# Patient Record
Sex: Female | Born: 1970 | Race: White | Hispanic: No | Smoking: Current every day smoker
Health system: Southern US, Community
[De-identification: ages and names within clinical notes are randomized; demographics above are authoritative.]

## PROBLEM LIST (undated history)

## (undated) DIAGNOSIS — M1990 Unspecified osteoarthritis, unspecified site: Secondary | ICD-10-CM

## (undated) DIAGNOSIS — E119 Type 2 diabetes mellitus without complications: Secondary | ICD-10-CM

## (undated) DIAGNOSIS — I10 Essential (primary) hypertension: Secondary | ICD-10-CM

## (undated) HISTORY — PX: TOE SURGERY: SHX1073

## (undated) HISTORY — PX: TYMPANOSTOMY TUBE PLACEMENT: SHX32

## (undated) HISTORY — PX: WISDOM TOOTH EXTRACTION: SHX21

## (undated) HISTORY — PX: TONSILLECTOMY AND ADENOIDECTOMY: SUR1326

---

## 2005-02-05 HISTORY — PX: BUNIONECTOMY: SHX129

## 2015-04-07 DIAGNOSIS — T85848A Pain due to other internal prosthetic devices, implants and grafts, initial encounter: Secondary | ICD-10-CM | POA: Insufficient documentation

## 2015-04-07 DIAGNOSIS — M2041 Other hammer toe(s) (acquired), right foot: Secondary | ICD-10-CM | POA: Insufficient documentation

## 2015-04-10 DIAGNOSIS — N921 Excessive and frequent menstruation with irregular cycle: Secondary | ICD-10-CM | POA: Insufficient documentation

## 2015-05-09 DIAGNOSIS — M2550 Pain in unspecified joint: Secondary | ICD-10-CM | POA: Insufficient documentation

## 2015-05-09 DIAGNOSIS — J0110 Acute frontal sinusitis, unspecified: Secondary | ICD-10-CM | POA: Insufficient documentation

## 2015-05-09 DIAGNOSIS — J309 Allergic rhinitis, unspecified: Secondary | ICD-10-CM | POA: Insufficient documentation

## 2015-05-09 DIAGNOSIS — L409 Psoriasis, unspecified: Secondary | ICD-10-CM | POA: Insufficient documentation

## 2015-05-09 DIAGNOSIS — F329 Major depressive disorder, single episode, unspecified: Secondary | ICD-10-CM | POA: Insufficient documentation

## 2015-05-09 DIAGNOSIS — N912 Amenorrhea, unspecified: Secondary | ICD-10-CM | POA: Insufficient documentation

## 2015-05-09 DIAGNOSIS — M62838 Other muscle spasm: Secondary | ICD-10-CM | POA: Insufficient documentation

## 2015-05-09 DIAGNOSIS — R609 Edema, unspecified: Secondary | ICD-10-CM | POA: Insufficient documentation

## 2015-05-09 DIAGNOSIS — M79673 Pain in unspecified foot: Secondary | ICD-10-CM | POA: Insufficient documentation

## 2015-05-09 DIAGNOSIS — K449 Diaphragmatic hernia without obstruction or gangrene: Secondary | ICD-10-CM

## 2015-05-09 DIAGNOSIS — K219 Gastro-esophageal reflux disease without esophagitis: Secondary | ICD-10-CM | POA: Insufficient documentation

## 2015-05-09 DIAGNOSIS — R7309 Other abnormal glucose: Secondary | ICD-10-CM | POA: Insufficient documentation

## 2015-05-09 DIAGNOSIS — F32A Depression, unspecified: Secondary | ICD-10-CM | POA: Insufficient documentation

## 2015-05-09 DIAGNOSIS — H1010 Acute atopic conjunctivitis, unspecified eye: Secondary | ICD-10-CM | POA: Insufficient documentation

## 2015-05-09 DIAGNOSIS — I10 Essential (primary) hypertension: Secondary | ICD-10-CM | POA: Insufficient documentation

## 2015-05-10 DIAGNOSIS — Z79899 Other long term (current) drug therapy: Secondary | ICD-10-CM | POA: Insufficient documentation

## 2016-03-20 DIAGNOSIS — Z862 Personal history of diseases of the blood and blood-forming organs and certain disorders involving the immune mechanism: Secondary | ICD-10-CM | POA: Insufficient documentation

## 2016-03-20 DIAGNOSIS — S81801S Unspecified open wound, right lower leg, sequela: Secondary | ICD-10-CM | POA: Insufficient documentation

## 2016-08-04 DIAGNOSIS — F411 Generalized anxiety disorder: Secondary | ICD-10-CM | POA: Insufficient documentation

## 2016-08-10 DIAGNOSIS — E785 Hyperlipidemia, unspecified: Secondary | ICD-10-CM | POA: Insufficient documentation

## 2017-04-05 ENCOUNTER — Ambulatory Visit (INDEPENDENT_AMBULATORY_CARE_PROVIDER_SITE_OTHER): Payer: BLUE CROSS/BLUE SHIELD

## 2017-04-05 ENCOUNTER — Encounter: Payer: Self-pay | Admitting: Podiatry

## 2017-04-05 ENCOUNTER — Ambulatory Visit (INDEPENDENT_AMBULATORY_CARE_PROVIDER_SITE_OTHER): Payer: BLUE CROSS/BLUE SHIELD | Admitting: Podiatry

## 2017-04-05 DIAGNOSIS — M779 Enthesopathy, unspecified: Secondary | ICD-10-CM | POA: Diagnosis not present

## 2017-04-05 DIAGNOSIS — M79671 Pain in right foot: Secondary | ICD-10-CM

## 2017-04-05 DIAGNOSIS — M79673 Pain in unspecified foot: Secondary | ICD-10-CM

## 2017-04-05 DIAGNOSIS — M79672 Pain in left foot: Secondary | ICD-10-CM

## 2017-04-05 DIAGNOSIS — G8929 Other chronic pain: Secondary | ICD-10-CM | POA: Diagnosis not present

## 2017-04-05 DIAGNOSIS — L4050 Arthropathic psoriasis, unspecified: Secondary | ICD-10-CM

## 2017-04-05 NOTE — Progress Notes (Signed)
Subjective:   Patient ID: Sherry Stein, female   DOB: 47 y.o.   MRN: [id number]   HPI 47 year old female presents the office today for concerns of bilateral foot.  The left side worse than right.  She gets pain to the outside aspect of her left foot mostly.  She does have a history of psoriatic arthritis who is currently on Humira, methotrexate, diclofenac and folic acid.  She states that she previously has had orthotics but they are several years old.  She has had multiple foot surgeries on the right side.  She has noticed that her foot is very flat her toes are turning.  She gets pain the also into the foot with walking.  She has limited activity in general given her feet and this is an ongoing issue.  She has no other concerns today.  No recent injury.  No increased swelling that she is noticed.   ROS History reviewed. No pertinent past medical history.  History reviewed. No pertinent surgical history.   Current Outpatient Medications:  .  acetaminophen-codeine (TYLENOL #3) 300-30 MG tablet, TAKE 1 TABLET BY MOUTH EVERY 8 HOURS AS NEEDED, Disp: , Rfl:  .  Adalimumab 40 MG/0.8ML PNKT, Inject into the skin., Disp: , Rfl:  .  DULoxetine (CYMBALTA) 20 MG capsule, TAKE 2 CAPSULES BY MOUTH DAILY, Disp: , Rfl:  .  lisinopril-hydrochlorothiazide (PRINZIDE,ZESTORETIC) 20-12.5 MG tablet, Take by mouth., Disp: , Rfl:  .  methotrexate (RHEUMATREX) 2.5 MG tablet, Take 8 tabs once weekly, Disp: , Rfl:  .  rosuvastatin (CRESTOR) 20 MG tablet, Take by mouth., Disp: , Rfl:  .  benzonatate (TESSALON) 100 MG capsule, , Disp: , Rfl: 0 .  clarithromycin (BIAXIN XL) 500 MG 24 hr tablet, , Disp: , Rfl: 0 .  diclofenac (VOLTAREN) 75 MG EC tablet, TK 1 T PO BID WF OR MILK, Disp: , Rfl: 3 .  folic acid (FOLVITE) 1 MG tablet, TK 1 T PO QD, Disp: , Rfl: 3 .  VITAMIN D, CHOLECALCIFEROL, PO, Take by mouth., Disp: , Rfl:   No Known Allergies  Social History   Socioeconomic History  . Marital status: Single   Spouse name: Not on file  . Number of children: Not on file  . Years of education: Not on file  . Highest education level: Not on file  Social Needs  . Financial resource strain: Not on file  . Food insecurity - worry: Not on file  . Food insecurity - inability: Not on file  . Transportation needs - medical: Not on file  . Transportation needs - non-medical: Not on file  Occupational History  . Not on file  Tobacco Use  . Smoking status: Unknown If Ever Smoked  . Smokeless tobacco: Never Used  Substance and Sexual Activity  . Alcohol use: Not on file  . Drug use: Not on file  . Sexual activity: Not on file  Other Topics Concern  . Not on file  Social History Narrative  . Not on file        Objective:  Physical Exam  General: AAO x3, NAD  Dermatological: Skin is warm, dry and supple bilateral. Nails x 10 are well manicured; remaining integument appears unremarkable at this time. There are no open sores, no preulcerative lesions, no rash or signs of infection present.  Vascular: Dorsalis Pedis artery and Posterior Tibial artery pedal pulses are 2/4 bilateral with immedate capillary fill time. Pedal hair growth present. No varicosities and no lower extremity edema present  bilateral. There is no pain with calf compression, swelling, warmth, erythema.   Neruologic: Grossly intact via light touch bilateral. Protective threshold with Semmes Wienstein monofilament intact to all pedal sites bilateral.   Musculoskeletal: There is a significant decrease in medial arch upon weightbearing.  There is tenderness to the lateral aspect of the foot and on the talonavicular joint there is no specific area pinpoint bony tenderness or pain to vibratory sensation.  Equinus is present.  There is no significant edema there is no erythema or increased warmth.  Muscular strength 5/5 in all groups tested bilateral.  Gait: Unassisted, Nonantalgic.       Assessment:   Bilateral foot pain left side  worse than right, psoriatic arthritis     Plan:  -Treatment options discussed including all alternatives, risks, and complications -Etiology of symptoms were discussed -X-rays were obtained and reviewed with the patient.  No definitive evidence of acute fracture identified today.  There is arthritic changes bilaterally but most notably there is arthritic changes present to the Lisfranc joint on the left side more so compared to the right.  This is where she has her symptoms as well.  We discussed various treatment options for this.  Her orthotics are quite old.  I do think that a new orthotic would be beneficial for her.  There are minimal her for inserts.  I will have her come back in to see Fenton FoyBetha or Raiford NobleRick for molding and she agrees with this plan.  She has no other concerns today.  She has Voltaren gel at home and she continues to do well.  Vivi BarrackMatthew R Mariha Sleeper DPM

## 2017-04-16 ENCOUNTER — Other Ambulatory Visit: Payer: BLUE CROSS/BLUE SHIELD

## 2017-05-07 ENCOUNTER — Ambulatory Visit (INDEPENDENT_AMBULATORY_CARE_PROVIDER_SITE_OTHER): Payer: BLUE CROSS/BLUE SHIELD | Admitting: Orthotics

## 2017-05-07 DIAGNOSIS — M7750 Other enthesopathy of unspecified foot: Secondary | ICD-10-CM | POA: Diagnosis not present

## 2017-05-07 DIAGNOSIS — M779 Enthesopathy, unspecified: Secondary | ICD-10-CM

## 2017-05-07 DIAGNOSIS — G8929 Other chronic pain: Secondary | ICD-10-CM

## 2017-05-07 DIAGNOSIS — M79673 Pain in unspecified foot: Secondary | ICD-10-CM

## 2017-05-07 NOTE — Progress Notes (Signed)
Patient came in today to pick up custom made foot orthotics.  The goals were accomplished and the patient reported no dissatisfaction with said orthotics.  Patient was advised of breakin period and how to report any issues. 

## 2017-07-23 DIAGNOSIS — M79676 Pain in unspecified toe(s): Secondary | ICD-10-CM

## 2017-10-24 DIAGNOSIS — M79676 Pain in unspecified toe(s): Secondary | ICD-10-CM

## 2018-08-24 ENCOUNTER — Other Ambulatory Visit: Payer: Self-pay

## 2018-08-24 ENCOUNTER — Emergency Department (HOSPITAL_COMMUNITY): Payer: Self-pay

## 2018-08-24 ENCOUNTER — Emergency Department (HOSPITAL_COMMUNITY)
Admission: EM | Admit: 2018-08-24 | Discharge: 2018-08-24 | Disposition: A | Discharge status: Home / Self Care | Payer: Self-pay | Attending: Emergency Medicine | Admitting: Emergency Medicine

## 2018-08-24 ENCOUNTER — Encounter (HOSPITAL_COMMUNITY): Payer: Self-pay | Admitting: Emergency Medicine

## 2018-08-24 DIAGNOSIS — L4050 Arthropathic psoriasis, unspecified: Secondary | ICD-10-CM | POA: Insufficient documentation

## 2018-08-24 DIAGNOSIS — M25561 Pain in right knee: Secondary | ICD-10-CM

## 2018-08-24 DIAGNOSIS — E119 Type 2 diabetes mellitus without complications: Secondary | ICD-10-CM | POA: Insufficient documentation

## 2018-08-24 DIAGNOSIS — Z87891 Personal history of nicotine dependence: Secondary | ICD-10-CM | POA: Insufficient documentation

## 2018-08-24 DIAGNOSIS — I10 Essential (primary) hypertension: Secondary | ICD-10-CM | POA: Insufficient documentation

## 2018-08-24 HISTORY — DX: Essential (primary) hypertension: I10

## 2018-08-24 HISTORY — DX: Unspecified osteoarthritis, unspecified site: M19.90

## 2018-08-24 HISTORY — DX: Type 2 diabetes mellitus without complications: E11.9

## 2018-08-24 NOTE — Discharge Instructions (Addendum)
Please follow up with your PCP regarding your knee pain You can also try to follow up with orthopedist Dr. Percell Miller if you choose to  Continue taking your Voltaren as prescribed  Return to the ED for any redness, increased warmth, or swelling of your knee or if you have difficulty bending it

## 2018-08-24 NOTE — ED Triage Notes (Signed)
Pt reports psoriatic arthritis. C/o pain to bilateral hands, feet and right knee. Ambulatory with cane.

## 2018-08-24 NOTE — ED Provider Notes (Signed)
[facility] EMERGENCY DEPARTMENT Provider Note   CSN: [id number] Arrival date & time: 08/24/18  0542    History   Chief Complaint Chief Complaint  Patient presents with   Joint Pain    HPI Sherry Stein is a 47 y.o. female with PMHx psoriatic arthritis, diabetes, HTN who presents to the ED complaining of sudden onset, constant, sharp, atraumatic, right knee pain that began 5 days ago. Pt reports hx of psoriatic arthritis and typically walks with a cane because of joint pain but states this pain is more severe. She went to an urgent care clinic who wanted to do xrays before doing a joint infection but did not have the capability. Pt is also in the process of having a disability appeal next week and would like xrays to take with her. She reports the urgent care prescribed her percocet for pain. She also takes Voltaren for discomfort. Denies fever, chills, swelling, weakness, numbness, or any other associated symptoms.        Past Medical History:  Diagnosis Date   Arthritis    Diabetes mellitus without complication ([place])    Hypertension     There are no active problems to display for this patient.   History reviewed. No pertinent surgical history.   OB History   No obstetric history on file.      Home Medications    Prior to Admission medications   Not on File    Family History No family history on file.  Social History Social History   Tobacco Use   Smoking status: Former Smoker   Smokeless tobacco: Never Used  Substance Use Topics   Alcohol use: Not Currently   Drug use: Never     Allergies   Patient has no known allergies.   Review of Systems Review of Systems  Constitutional: Negative for chills and fever.  Musculoskeletal: Positive for arthralgias. Negative for joint swelling.  Skin: Negative for color change.     Physical Exam Updated Vital Signs BP (!) 144/92 (BP Location: Right Arm)    Pulse 84    Temp 98.4  F (36.9 C) (Oral)    Resp 18    SpO2 98%   Physical Exam Vitals signs and nursing note reviewed.  Constitutional:      Appearance: She is not ill-appearing.  HENT:     Head: Normocephalic and atraumatic.  Eyes:     Conjunctiva/sclera: Conjunctivae normal.  Cardiovascular:     Rate and Rhythm: Normal rate and regular rhythm.  Pulmonary:     Effort: Pulmonary effort is normal.     Breath sounds: Normal breath sounds.  Musculoskeletal:     Comments: No obvious swelling noted to right knee compared to left. Pt has minimal tenderness to anterior aspect along patellofemoral tendon with palpation. No tenderness to posterior aspect of knee. No Baker's cyst appreciated. ROM intact throughout. Strength and sensation intact. 2+ distal pulses.   Skin:    General: Skin is warm and dry.     Coloration: Skin is not jaundiced.  Neurological:     Mental Status: She is alert.      ED Treatments / Results  Labs (all labs ordered are listed, but only abnormal results are displayed) Labs Reviewed - No data to display  EKG None  Radiology Dg Knee Complete 4 Views Right  Result Date: 08/24/2018 CLINICAL DATA:  RIGHT knee pain. History of psoriatic arthritis. EXAM: RIGHT KNEE - COMPLETE 4+ VIEW COMPARISON:  None. FINDINGS: Severe  tricompartmental degenerative osteoarthritis, with associated joint space narrowings and large osteophyte formation. Most severe degenerative change is seen at the medial and patellofemoral compartments. No acute appearing osseous abnormality. Probable joint effusion within the suprapatellar bursa. Superficial soft tissues about the RIGHT knee are unremarkable. IMPRESSION: 1. Severe tricompartmental degenerative osteoarthritis. 2. Probable joint effusion. 3. No acute findings. Electronically Signed   By: Bary RichardStan  Maynard M.D.   On: 08/24/2018 08:39    Procedures Procedures (including critical care time)  Medications Ordered in ED Medications - No data to  display   Initial Impression / Assessment and Plan / ED Course  I have reviewed the triage vital signs and the nursing notes.  Pertinent labs & imaging results that were available during my care of the patient were reviewed by me and considered in my medical decision making (see chart for details).    48 year old female presenting with right knee pain, atraumatic, hx of psoriatic arthritis. States this feels different. Requesting xrays for disability appeals hearing next week. Will oblige today although suspect typically arthritic changes on xray. No joint effusion palpated. No erythema or edema to suggest gout vs septic arthritis. Pt without complaints of fever or chills. She is afebrile in the ED today as well without tachycardia or tachypnea. Reports taking Percocet for pain although PMDP reviewed without findings. Will reevaluate once imaging returns.   Xrays consistent with arthritic changes; radiologist also reads probable joint effusion to suprapatellar bursa. No skin changes overlying the knee during my exam. No fevers or chills at home and VSS. Do not feel pt needs joint aspiration at this time. Discussed medications optioned with patient; offered short course of prednisone although pt declined given hx of diabetes as well as her being on Voltaren and need to not take NSAIDs while on the prednisone. Pt states the voltaren works for her and she would rather stick with that. Have given her referral to orthopedics; she can attempt to follow up with them although may need to self pay given she does not have insurance currently. She agrees to follow up with PCP. Strict return precautions discussed today. Pt stable for discharge home.        Final Clinical Impressions(s) / ED Diagnoses   Final diagnoses:  Arthralgia of right knee  Psoriatic arthritis [facility](HCC)    ED Discharge Orders    None       Tanda RockersVenter, Hansini Clodfelter, PA-C 08/24/18 1817    Terrilee FilesButler, Michael C, MD 08/25/18 1021

## 2018-08-24 NOTE — ED Notes (Signed)
Patient transported to X-ray 

## 2018-08-27 ENCOUNTER — Encounter: Payer: Self-pay | Admitting: Podiatry

## 2018-12-07 HISTORY — PX: REPLACEMENT TOTAL KNEE: SUR1224

## 2019-03-09 HISTORY — PX: REPLACEMENT TOTAL KNEE: SUR1224

## 2019-07-07 HISTORY — PX: CARPAL TUNNEL RELEASE: SHX101
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
## 2020-05-02 ENCOUNTER — Ambulatory Visit (INDEPENDENT_AMBULATORY_CARE_PROVIDER_SITE_OTHER): Payer: Medicare Other | Admitting: Rheumatology

## 2020-05-02 ENCOUNTER — Ambulatory Visit: Payer: Self-pay

## 2020-05-02 ENCOUNTER — Ambulatory Visit (HOSPITAL_COMMUNITY)
Admission: RE | Admit: 2020-05-02 | Discharge: 2020-05-02 | Disposition: A | Discharge status: Home / Self Care | Payer: Medicare Other | Source: Ambulatory Visit | Attending: Rheumatology | Admitting: Rheumatology

## 2020-05-02 ENCOUNTER — Encounter: Payer: Self-pay | Admitting: Rheumatology

## 2020-05-02 ENCOUNTER — Other Ambulatory Visit: Payer: Self-pay

## 2020-05-02 VITALS — BP 126/81 | HR 56 | Resp 16 | Ht 63.5 in | Wt 217.8 lb

## 2020-05-02 DIAGNOSIS — M2041 Other hammer toe(s) (acquired), right foot: Secondary | ICD-10-CM

## 2020-05-02 DIAGNOSIS — M79641 Pain in right hand: Secondary | ICD-10-CM | POA: Diagnosis not present

## 2020-05-02 DIAGNOSIS — L409 Psoriasis, unspecified: Secondary | ICD-10-CM | POA: Diagnosis not present

## 2020-05-02 DIAGNOSIS — I10 Essential (primary) hypertension: Secondary | ICD-10-CM

## 2020-05-02 DIAGNOSIS — M79672 Pain in left foot: Secondary | ICD-10-CM | POA: Diagnosis not present

## 2020-05-02 DIAGNOSIS — L4050 Arthropathic psoriasis, unspecified: Secondary | ICD-10-CM | POA: Diagnosis not present

## 2020-05-02 DIAGNOSIS — M79671 Pain in right foot: Secondary | ICD-10-CM | POA: Diagnosis not present

## 2020-05-02 DIAGNOSIS — G8929 Other chronic pain: Secondary | ICD-10-CM

## 2020-05-02 DIAGNOSIS — R5383 Other fatigue: Secondary | ICD-10-CM

## 2020-05-02 DIAGNOSIS — Z8379 Family history of other diseases of the digestive system: Secondary | ICD-10-CM

## 2020-05-02 DIAGNOSIS — M79642 Pain in left hand: Secondary | ICD-10-CM | POA: Diagnosis not present

## 2020-05-02 DIAGNOSIS — Z1211 Encounter for screening for malignant neoplasm of colon: Secondary | ICD-10-CM

## 2020-05-02 DIAGNOSIS — Z79899 Other long term (current) drug therapy: Secondary | ICD-10-CM

## 2020-05-02 DIAGNOSIS — Z96653 Presence of artificial knee joint, bilateral: Secondary | ICD-10-CM

## 2020-05-02 DIAGNOSIS — E782 Mixed hyperlipidemia: Secondary | ICD-10-CM

## 2020-05-02 DIAGNOSIS — F411 Generalized anxiety disorder: Secondary | ICD-10-CM

## 2020-05-02 DIAGNOSIS — M533 Sacrococcygeal disorders, not elsewhere classified: Secondary | ICD-10-CM

## 2020-05-02 DIAGNOSIS — R7303 Prediabetes: Secondary | ICD-10-CM

## 2020-05-02 DIAGNOSIS — K219 Gastro-esophageal reflux disease without esophagitis: Secondary | ICD-10-CM

## 2020-05-02 DIAGNOSIS — M2042 Other hammer toe(s) (acquired), left foot: Secondary | ICD-10-CM

## 2020-05-02 DIAGNOSIS — K449 Diaphragmatic hernia without obstruction or gangrene: Secondary | ICD-10-CM

## 2020-05-02 LAB — CBC WITH DIFFERENTIAL/PLATELET
HCT: 44.8 % (ref 35.0–45.0)
Neutrophils Relative %: 67.6 %

## 2020-05-02 NOTE — Patient Instructions (Addendum)
Standing Labs We placed an order today for your standing lab work.   Please have your standing labs drawn in June and every 3 months  If possible, please have your labs drawn 2 weeks prior to your appointment so that the provider can discuss your results at your appointment.  We have open lab daily Monday through Thursday from 1:30-4:30 PM and Friday from 1:30-4:00 PM at the office of Dr. Pollyann Savoy, [facility].   Please be advised, all patients with office appointments requiring lab work will take precedents over walk-in lab work.  If possible, please come for your lab work on Monday and Friday afternoons, as you may experience shorter wait times. The office is located at [address], [address], [place], [place] [address] No appointment is necessary.   Labs are drawn by [facility]. Please bring your co-pay at the time of your lab draw.  You may receive a bill from [facility] for your lab work.  If you wish to have your labs drawn at another location, please call the office 24 hours in advance to send orders.  If you have any questions regarding directions or hours of operation,  please call [phone].   As a reminder, please drink plenty of water prior to coming for your lab work. Thanks!     Methotrexate tablets What is this medicine? METHOTREXATE (METH oh TREX ate) is a chemotherapy drug used to treat cancer including breast cancer, leukemia, and lymphoma. This medicine can also be used to treat psoriasis and certain kinds of arthritis. This medicine may be used for other purposes; ask your health care provider or pharmacist if you have questions. COMMON BRAND NAME(S): Rheumatrex, Trexall What should I tell my health care provider before I take this medicine? They need to know if you have any of these conditions:  fluid in the stomach area or lungs  if you often drink alcohol  infection or immune system problems  kidney disease or on hemodialysis  liver  disease  low blood counts, like low white cell, platelet, or red cell counts  lung disease  radiation therapy  stomach ulcers  ulcerative colitis  an unusual or allergic reaction to methotrexate, other medicines, foods, dyes, or preservatives  pregnant or trying to get pregnant  breast-feeding How should I use this medicine? Take this medicine by mouth with a glass of water. Follow the directions on the prescription label. Take your medicine at regular intervals. Do not take it more often than directed. Do not stop taking except on your doctor's advice. Make sure you know why you are taking this medicine and how often you should take it. If this medicine is used for a condition that is not cancer, like arthritis or psoriasis, it should be taken weekly, NOT daily. Taking this medicine more often than directed can cause serious side effects, even death. Talk to your healthcare provider about safe handling and disposal of this medicine. You may need to take special precautions. Talk to your pediatrician regarding the use of this medicine in children. While this drug may be prescribed for selected conditions, precautions do apply. Overdosage: If you think you have taken too much of this medicine contact a poison control center or emergency room at once. NOTE: This medicine is only for you. Do not share this medicine with others. What if I miss a dose? If you miss a dose, talk with your doctor or health care professional. Do not take double or extra doses. What may  interact with this medicine? Do not take this medicine with any of the following medications:  acitretin This medicine may also interact with the following medication:  aspirin and aspirin-like medicines including salicylates  azathioprine  certain antibiotics like penicillins, tetracycline, and chloramphenicol  certain medicines that treat or prevent blood clots like warfarin, apixaban, dabigatran, and  rivaroxaban  certain medicines for stomach problems like esomeprazole, omeprazole, pantoprazole  cyclosporine  dapsone  diuretics  gold  hydroxychloroquine  live virus vaccines  medicines for infection like acyclovir, adefovir, amphotericin B, bacitracin, cidofovir, foscarnet, ganciclovir, gentamicin, pentamidine, vancomycin  mercaptopurine  NSAIDs, medicines for pain and inflammation, like ibuprofen or naproxen  other cytotoxic agents  pamidronate  pemetrexed  penicillamine  phenylbutazone  phenytoin  probenecid  pyrimethamine  retinoids such as isotretinoin and tretinoin  steroid medicines like prednisone or cortisone  sulfonamides like sulfasalazine and trimethoprim/sulfamethoxazole  theophylline  zoledronic acid This list may not describe all possible interactions. Give your health care provider a list of all the medicines, herbs, non-prescription drugs, or dietary supplements you use. Also tell them if you smoke, drink alcohol, or use illegal drugs. Some items may interact with your medicine. What should I watch for while using this medicine? Avoid alcoholic drinks. This medicine can make you more sensitive to the sun. Keep out of the sun. If you cannot avoid being in the sun, wear protective clothing and use sunscreen. Do not use sun lamps or tanning beds/booths. You may need blood work done while you are taking this medicine. Call your doctor or health care professional for advice if you get a fever, chills or sore throat, or other symptoms of a cold or flu. Do not treat yourself. This drug decreases your body's ability to fight infections. Try to avoid being around people who are sick. This medicine may increase your risk to bruise or bleed. Call your doctor or health care professional if you notice any unusual bleeding. Be careful brushing or flossing your teeth or using a toothpick because you may get an infection or bleed more easily. If you have any  dental work done, tell your dentist you are receiving this medicine. Check with your doctor or health care professional if you get an attack of severe diarrhea, nausea and vomiting, or if you sweat a lot. The loss of too much body fluid can make it dangerous for you to take this medicine. Talk to your doctor about your risk of cancer. You may be more at risk for certain types of cancers if you take this medicine. Do not become pregnant while taking this medicine or for 6 months after stopping it. Women should inform their health care provider if they wish to become pregnant or think they might be pregnant. Men should not father a child while taking this medicine and for 3 months after stopping it. There is potential for serious harm to an unborn child. Talk to your health care provider for more information. Do not breast-feed an infant while taking this medicine or for 1 week after stopping it. This medicine may make it more difficult to get pregnant or father a child. Talk to your health care provider if you are concerned about your fertility. What side effects may I notice from receiving this medicine? Side effects that you should report to your doctor or health care professional as soon as possible:  allergic reactions like skin rash, itching or hives, swelling of the face, lips, or tongue  breathing problems or shortness of breath  diarrhea  dry, nonproductive cough  low blood counts - this medicine may decrease the number of white blood cells, red blood cells and platelets. You may be at increased risk for infections and bleeding.  mouth sores  redness, blistering, peeling or loosening of the skin, including inside the mouth  signs of infection - fever or chills, cough, sore throat, pain or trouble passing urine  signs and symptoms of bleeding such as bloody or black, tarry stools; red or dark-brown urine; spitting up blood or brown material that looks like coffee grounds; red spots on  the skin; unusual bruising or bleeding from the eye, gums, or nose  signs and symptoms of kidney injury like trouble passing urine or change in the amount of urine  signs and symptoms of liver injury like dark yellow or brown urine; general ill feeling or flu-like symptoms; light-colored stools; loss of appetite; nausea; right upper belly pain; unusually weak or tired; yellowing of the eyes or skin Side effects that usually do not require medical attention (report to your doctor or health care professional if they continue or are bothersome):  dizziness  hair loss  tiredness  upset stomach  vomiting This list may not describe all possible side effects. Call your doctor for medical advice about side effects. You may report side effects to FDA at 1-800-FDA-1088. Where should I keep my medicine? Keep out of the reach of children and pets. Store at room temperature between 20 and 25 degrees C (68 and 77 degrees F). Protect from light. Get rid of any unused medicine after the expiration date. Talk to your health care provider about how to dispose of unused medicine. Special directions may apply. NOTE: This sheet is a summary. It may not cover all possible information. If you have questions about this medicine, talk to your doctor, pharmacist, or health care provider.  2021 Elsevier/Gold Standard (2019-08-24 10:40:39)  COVID-19 vaccine recommendations:   COVID-19 vaccine is recommended for everyone (unless you are allergic to a vaccine component), even if you are on a medication that suppresses your immune system.   If you are on Methotrexate, Cellcept (mycophenolate), Rinvoq, Harriette Ohara, and Olumiant- hold the medication for 1 week after each vaccine. Hold Methotrexate for 2 weeks after the single dose COVID-19 vaccine.   The recommendations are for the patients on immunosuppressive therapy should receive first 3 COVID-19 vaccines 1 month apart and then a fourth dose (booster) 3 months after  the third dose.  Do not take Tylenol or any anti-inflammatory medications (NSAIDs) 24 hours prior to the COVID-19 vaccination.   There is no direct evidence about the efficacy of the COVID-19 vaccine in individuals who are on medications that suppress the immune system.   Even if you are fully vaccinated, and you are on any medications that suppress your immune system, please continue to wear a mask, maintain at least six feet social distance and practice hand hygiene.   If you develop a COVID-19 infection, please contact your PCP or our office to determine if you need monoclonal antibody infusion.  The booster vaccine is now available for immunocompromised patients.   Please see the following web sites for updated information.   [email or web address]  Vaccines You are taking a medication(s) that can suppress your immune system.  The following immunizations are recommended: . Flu annually . Covid-19  . Pneumonia (Pneumovax 23 and Prevnar 13 spaced at least 1 year apart) . Shingrix (after age 52)  Please check with your PCP to make sure  you are up to date.  Heart Disease Prevention   Your inflammatory disease increases your risk of heart disease which includes heart attack, stroke, atrial fibrillation (irregular heartbeats), high blood pressure, heart failure and atherosclerosis (plaque in the arteries).  It is important to reduce your risk by:   . Keep blood pressure, cholesterol, and blood sugar at healthy levels   . Smoking Cessation   . Maintain a healthy weight  o BMI 20-25   . Eat a healthy diet  o Plenty of fresh fruit, vegetables, and whole grains  o Limit saturated fats, foods high in sodium, and added sugars  o DASH and Mediterranean diet   . Increase physical activity  o Recommend moderate physically activity for 150 minutes per week/ 30 minutes a day for five days a week These can be broken up into  three separate ten-minute sessions during the day.   . Reduce Stress  . Meditation, slow breathing exercises, yoga, coloring books  . Dental visits twice a year

## 2020-05-02 NOTE — Progress Notes (Signed)
Pharmacy Note  Subjective:  Patient presents today to [facility] for follow up office visit.   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 Patient will continue methotrexate 25mg  once weekly with folic acid. She has tried many biologics in the past, including Humira and Cosentyx. She stopped Cosentyx more than a year ago but she is familiar with patient assistance programs as this is how she accesses all of her medications.  Objective:  Baseline labs drawn today  Chest x-ray: ordered today  Does patient have a history of inflammatory bowel disease? No; son has Crohn's disease - GI referral placed  Assessment/Plan:  Counseled patient that Cosentyx is a IL-17 inhibitor that works to reduce pain and inflammation associated with arthritis.  Counseled patient on purpose, proper use, and adverse effects of Cosentyx.  Reviewed the most common adverse effects of infection, inflammatory bowel disease, and allergic reaction.  Counseled patient that Cosentyx should be held prior to scheduled surgery.  Counseled patient to avoid live vaccines while on Cosentyx.  Recommend annual influenza, Pneumovax 23, Prevnar 13, and Shingrix as indicated.   Patient will continue methotrexate 25mg  once weekly (in divided doses). Patient will increase folic acid to 2 mg once daily. Advised patient to divide methotrexate: 5 tabs in morning, 5 tabs in evening or otherwise 5 tabs on Wednesday and 5 tabs on Saturday to increase absorption and maximize effectiveness. At this time, she declined transitioning to injectable MTX - but will reach out if she'd like to do so. She would access Rasuvo/Otrexip via patient assistance as well. Patient verbalized understanding  Reviewed the importance of regular labs while on Cosentyx. Standing orders placed. Provided patient with medication education material and answered all questions.  Patient consented to  Cosentyx.  Will upload consent into patient's chart.  Will apply for Cosentyx through patient's insurance.  Reviewed storage information for Cosentyx.  Advised initial injection must be administered in office.  Patient voiced understanding.    Patient dose will be for plaque psoriasis +/- psoriatic arthritis 300 mg every 7 days for 5 weeks then 300 mg every 28 days.  Prescription pending labs and PAP approval. Patient signed her portion today

## 2020-05-03 LAB — SEDIMENTATION RATE: Sed Rate: 17 mm/h (ref 0–30)

## 2020-05-03 LAB — CBC WITH DIFFERENTIAL/PLATELET
Hemoglobin: 15.3 g/dL (ref 11.7–15.5)
RBC: 4.6 10*6/uL (ref 3.80–5.10)

## 2020-05-03 LAB — COMPLETE METABOLIC PANEL WITH GFR: Total Protein: 7 g/dL (ref 6.1–8.1)

## 2020-05-03 NOTE — Progress Notes (Signed)
Chest x-ray was normal.  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

## 2020-05-04 LAB — COMPLETE METABOLIC PANEL WITH GFR
AST: 20 U/L (ref 10–35)
CO2: 23 mmol/L (ref 20–32)

## 2020-05-05 LAB — COMPLETE METABOLIC PANEL WITH GFR
AG Ratio: 1.9 (calc) (ref 1.0–2.5)
ALT: 20 U/L (ref 6–29)
Albumin: 4.6 g/dL (ref 3.6–5.1)
Alkaline phosphatase (APISO): 71 U/L (ref 37–153)
BUN: 20 mg/dL (ref 7–25)
Calcium: 9 mg/dL (ref 8.6–10.4)
Chloride: 100 mmol/L (ref 98–110)
Creat: 0.67 mg/dL (ref 0.50–1.05)
GFR, Est African American: 118 mL/min/{1.73_m2} (ref >=60)
GFR, Est Non African American: 102 mL/min/{1.73_m2} (ref >=60)
Globulin: 2.4 g/dL (calc) (ref 1.9–3.7)
Glucose, Bld: 94 mg/dL (ref 65–99)
Potassium: 3.9 mmol/L (ref 3.5–5.3)
Sodium: 139 mmol/L (ref 135–146)
Total Bilirubin: 0.4 mg/dL (ref 0.2–1.2)

## 2020-05-05 LAB — QUANTIFERON-TB GOLD PLUS
Mitogen-NIL: >10 IU/mL
NIL: 0.03 IU/mL
QuantiFERON-TB Gold Plus: NEGATIVE
TB1-NIL: <0 IU/mL
TB2-NIL: <0 IU/mL

## 2020-05-05 LAB — CBC WITH DIFFERENTIAL/PLATELET
Absolute Monocytes: 510 cells/uL (ref 200–950)
Basophils Absolute: 46 cells/uL (ref 0–200)
Basophils Relative: 0.5 %
Eosinophils Absolute: 309 cells/uL (ref 15–500)
Eosinophils Relative: 3.4 %
Lymphs Abs: 2084 cells/uL (ref 850–3900)
MCH: 33.3 pg — ABNORMAL HIGH (ref 27.0–33.0)
MCHC: 34.2 g/dL (ref 32.0–36.0)
MCV: 97.4 fL (ref 80.0–100.0)
MPV: 10.3 fL (ref 7.5–12.5)
Monocytes Relative: 5.6 %
Neutro Abs: 6152 cells/uL (ref 1500–7800)
Platelets: 282 10*3/uL (ref 140–400)
RDW: 13.1 % (ref 11.0–15.0)
Total Lymphocyte: 22.9 %
WBC: 9.1 10*3/uL (ref 3.8–10.8)

## 2020-05-05 LAB — PROTEIN ELECTROPHORESIS, SERUM, WITH REFLEX
Albumin ELP: 4.3 g/dL (ref 3.8–4.8)
Alpha 1: 0.3 g/dL (ref 0.2–0.3)
Alpha 2: 0.9 g/dL (ref 0.5–0.9)
Beta 2: 0.3 g/dL (ref 0.2–0.5)
Beta Globulin: 0.4 g/dL (ref 0.4–0.6)
Gamma Globulin: 0.7 g/dL — ABNORMAL LOW (ref 0.8–1.7)
Total Protein: 7 g/dL (ref 6.1–8.1)

## 2020-05-05 LAB — HEPATITIS B CORE ANTIBODY, IGM: Hep B C IgM: NONREACTIVE

## 2020-05-05 LAB — CYCLIC CITRUL PEPTIDE ANTIBODY, IGG: Cyclic Citrullin Peptide Ab: <16 UNITS

## 2020-05-05 LAB — RHEUMATOID FACTOR: Rheumatoid fact SerPl-aCnc: <14 IU/mL (ref <14)

## 2020-05-05 LAB — HEPATITIS B SURFACE ANTIGEN: Hepatitis B Surface Ag: NONREACTIVE

## 2020-05-05 LAB — IGG, IGA, IGM
IgG (Immunoglobin G), Serum: 789 mg/dL (ref 600–1640)
IgM, Serum: 77 mg/dL (ref 50–300)
Immunoglobulin A: 97 mg/dL (ref 47–310)

## 2020-05-05 LAB — HIV ANTIBODY (ROUTINE TESTING W REFLEX): HIV 1&2 Ab, 4th Generation: NONREACTIVE

## 2020-05-05 LAB — CK: Total CK: 110 U/L (ref 29–143)

## 2020-05-05 LAB — HEPATITIS C ANTIBODY
Hepatitis C Ab: NONREACTIVE
SIGNAL TO CUT-OFF: 0.01 (ref <1.00)

## 2020-05-05 LAB — ANA: Anti Nuclear Antibody (ANA): NEGATIVE

## 2020-05-05 LAB — IFE INTERPRETATION: Immunofix Electr Int: NOT DETECTED

## 2020-05-10 ENCOUNTER — Telehealth: Payer: Self-pay | Admitting: Pharmacist

## 2020-05-10 NOTE — Telephone Encounter (Signed)
Devki, I reviewed the chart.  It is okay to start on Cosentyx.

## 2020-05-10 NOTE — Telephone Encounter (Signed)
Received a fax from  [employer] regarding an approval for COSENTYX patient assistance through 02/04/21  Phone number: [phone]  Patient scheduled for Cosentyx new start 05/11/20 @ 10am.  Chesley Mires, PharmD, MPH Clinical Pharmacist (Rheumatology and Pulmonology)

## 2020-05-11 ENCOUNTER — Other Ambulatory Visit: Payer: Self-pay

## 2020-05-11 ENCOUNTER — Ambulatory Visit: Payer: Medicare Other | Admitting: Pharmacist

## 2020-05-11 DIAGNOSIS — L4050 Arthropathic psoriasis, unspecified: Secondary | ICD-10-CM

## 2020-05-11 DIAGNOSIS — Z79899 Other long term (current) drug therapy: Secondary | ICD-10-CM

## 2020-05-11 DIAGNOSIS — L409 Psoriasis, unspecified: Secondary | ICD-10-CM

## 2020-05-11 MED ORDER — COSENTYX SENSOREADY (300 MG) 150 MG/ML ~~LOC~~ SOAJ: SUBCUTANEOUS | 0 refills | Status: DC

## 2020-05-11 MED ORDER — COSENTYX SENSOREADY (300 MG) 150 MG/ML ~~LOC~~ SOAJ: 300.0000 mg | SUBCUTANEOUS | 0 refills | Status: DC

## 2020-05-11 NOTE — Patient Instructions (Addendum)
Your next Cosentyx dose is due on 4/13, 4/20, 4/27, 5/4 and every 4 weeks thereafter  Your prescription will be shipped from [facility]. Their phone number is [phone] Please call to schedule shipment if you don't hear from them by Monday. They will mail medication to your home.  Labs are due in 1 month (at f/u visit) then every 3 months.  Remember the 5 C's:  COUNTER - leave on the counter at least 30 minutes but up to overnight to bring medication to room temperature. This may help prevent stinging  COLD - place something cold (like an ice gel pack or cold water bottle) on the injection site just before cleansing with alcohol. This may help reduce pain  CLARITIN - use Claritin (generic name is loratadine) for the first two weeks of treatment or the day of, the day before, and the day after injecting. This will help to minimize injection site reactions  CORTISONE CREAM - apply if injection site is irritated and itching  CALL ME - if injection site reaction is bigger than the size of your fist, looks infected, blisters, or if you develop hives

## 2020-05-11 NOTE — Progress Notes (Signed)
Pharmacy Note  Subjective:   Patient presents to clinic today to receive first dose of Cosentyx for psoriatic arthritis and plaque psoriasis. Patient is uninsured - was receiving Cosentyx through [employer] patient assistance before.  She has been taking methotrexate 25mg  once weekly with folic acid 1mg  once daily. She has tried many biologics in the past including Humira and Cosentyx. Discontinued Cosentyx d/t insurance changes. She established care at [facility] last week (05/02/20).  Patient running a fever or have signs/symptoms of infection? No  Patient currently on antibiotics for the treatment of infection? No  Patient have any upcoming invasive procedures/surgeries? No  Objective: CMP     Component Value Date/Time   NA 139 05/02/2020 1109   K 3.9 05/02/2020 1109   CL 100 05/02/2020 1109   CO2 23 05/02/2020 1109   GLUCOSE 94 05/02/2020 1109   BUN 20 05/02/2020 1109   CREATININE 0.67 05/02/2020 1109   CALCIUM 9.0 05/02/2020 1109   PROT 7.0 05/02/2020 1109   PROT 7.0 05/02/2020 1109   AST 20 05/02/2020 1109   ALT 20 05/02/2020 1109   BILITOT 0.4 05/02/2020 1109   GFRNONAA 102 05/02/2020 1109   GFRAA 118 05/02/2020 1109    CBC    Component Value Date/Time   WBC 9.1 05/02/2020 1109   RBC 4.60 05/02/2020 1109   HGB 15.3 05/02/2020 1109   HCT 44.8 05/02/2020 1109   PLT 282 05/02/2020 1109   MCV 97.4 05/02/2020 1109   MCH 33.3 (H) 05/02/2020 1109   MCHC 34.2 05/02/2020 1109   RDW 13.1 05/02/2020 1109   LYMPHSABS 2,084 05/02/2020 1109   EOSABS 309 05/02/2020 1109   BASOSABS 46 05/02/2020 1109    Baseline Immunosuppressant Therapy Labs TB GOLD Quantiferon TB Gold Latest Ref Rng & Units 05/02/2020  Quantiferon TB Gold Plus NEGATIVE NEGATIVE   Hepatitis Panel Hepatitis Latest Ref Rng & Units 05/02/2020  Hep B Surface Ag NON-REACTI NON-REACTIVE  Hep B IgM NON-REACTI NON-REACTIVE  Hep C Ab NON-REACTI NON-REACTIVE  Hep C Ab NON-REACTI NON-REACTIVE    HIV Lab Results  Component Value Date   HIV NON-REACTIVE 05/02/2020   Immunoglobulins Immunoglobulin Electrophoresis Latest Ref Rng & Units 05/02/2020  IgA  47 - 310 mg/dL 97  IgG 05/04/2020 - 05/04/2020 mg/dL 812  IgM 50 - 7,517 mg/dL 77   SPEP Serum Protein Electrophoresis Latest Ref Rng & Units 05/02/2020  Total Protein 6.1 - 8.1 g/dL 7.0  Albumin 3.8 - 4.8 g/dL 4.3  Alpha-1 0.2 - 0.3 g/dL 0.3  Alpha-2 0.5 - 0.9 g/dL 0.9  Beta Globulin 0.4 - 0.6 g/dL 0.4  Beta 2 0.2 - 0.5 g/dL 0.3  Gamma Globulin 0.8 - 1.7 g/dL 749)   Chest x-ray: WNL on 05/02/20  Assessment/Plan:  Demonstrated proper injection technique with Cosentyx demo device  Patient able to demonstrate proper injection technique using the teach back method.  Patient self injected in the right and left with:  Sample Medication: Cosentyx 150mg /mL Sensoready (300mg ) NDC: 4.4(H Lot: 05/04/20 Expiration: 07/2021  Patient tolerated well.  Observed for 30 mins in office for adverse reaction.   Patient's dose will be Cosentyx 300 mg (as 2 divided doses) at Week 0 (administered in clinic today), Week 1, 2, 3, 4 then every 4 weeks thereafter. Patient provided with calendar for 2022 doses.  Patient will continue methotrexate 25mg  every week along with folic acid 2mg . She takes methotrexate on Thursdays and advised to divide doses into morning and evening to increase absorption. We  discussed increasing folic acid to 2mg .  Can consider injectable methotrexate in the future (patient was not interested at last visit b/c she did not want to make too many changes at once).  Discussed importance of holding Cosentyx with infection, surgery, and antibiotic use. Discussed that she will need clearance by surgeons to resume Cosentyx in the future.  Patient is to return in 1 month for labs. F/u already scheduled with Dr. on 06/07/20.  Standing orders placed today for CBC with diff/platelet and CMP with GFR.  Cosentyx approved through  08/07/20 patient assistance.  Prescription sent with application (ordered as no-print). Future Cosentyx refills will go to [facility] by [employer] in [place], [place].  Provided verbal consent to ship to patient's home. Advised her to reach out to [place] if she doesn't receive a call to schedule shipment by the end of the day Friday. Pt verbalized understanding  All questions encouraged and answered.  Instructed patient to call with any further questions or concerns.  Monday, PharmD, MPH Clinical Pharmacist (Rheumatology and Pulmonology)

## 2020-05-17 ENCOUNTER — Telehealth: Payer: Self-pay

## 2020-05-17 NOTE — Telephone Encounter (Signed)
Returned call to [employer] regarding Cosentyx. Advised medication should be shipped to patient's home and patient has already received first dose in the clinic. Nothing further needed.

## 2020-05-17 NOTE — Telephone Encounter (Signed)
[facility] calling on behalf of [employer] left a voicemail stating they are trying to get authorization verbal consent on where patient's loading doses of Cosentyx will be delivered.  Will they be delivered to patient's home or healthcare provider.  We will need to set delivery or get verbal authorization if we are shipping to the patient.  Please call #[phone]  Hours of operation Mon-Fri 8 am to 7 pm CST

## 2020-05-17 NOTE — Telephone Encounter (Signed)
FYI: Patient will be coming by the office tomorrow to pick up cosentyx samples. She is due to inject tomorrow and has not heard from [employer] to schedule shipment of cosentyx.   Sample has been reserved in the fridge.

## 2020-05-18 NOTE — Telephone Encounter (Signed)
Medication Samples have been provided to the patient.  Drug name: cosentyx     Strength: 300mg  dose        Qty: 1  LOT:  Exp.Date: 07/2021  Dosing instructions: Inject 300mg  into the skin at week 1,2,3,4 for loading dose.   patient will be calling [employer] today to schedule shipment.

## 2020-06-05 NOTE — Progress Notes (Signed)
Office Visit Note  Patient: Sherry Stein             Date of Birth: 1970-09-07           MRN: [id number]             PCP: Juanda Chance Referring: Mindi Curling, PA-C Visit Date: 06/13/2020 Occupation: @GUAROCC @  Subjective:  Follow-up (Doing good, improving)   History of Present Illness: Sherry Stein is a 50 y.o. female with a history of psoriatic arthritis and psoriasis.  She was on Cosentyx since 2018 and had a gap from 2019 till 2022.  We restarted Cosentyx on May 11, 2020.  Gradually her symptoms are improving.  She has noticed decrease in joint pain and joint swelling.  The dactylitis is improved.  She continues to have a lot of pain and discomfort in her left SI joint.  She has difficulty walking.  Psoriasis is improving per patient.  Activities of Daily Living:  Patient reports morning stiffness for 3 hours.   Patient Reports nocturnal pain.  Difficulty dressing/grooming: Denies Difficulty climbing stairs: Denies Difficulty getting out of chair: Denies Difficulty using hands for taps, buttons, cutlery, and/or writing: Reports  Review of Systems  Constitutional: Positive for fatigue. Negative for night sweats, weight gain and weight loss.  HENT: Negative for mouth sores, trouble swallowing, trouble swallowing, mouth dryness and nose dryness.   Eyes: Negative for pain, redness, visual disturbance and dryness.  Respiratory: Negative for cough, shortness of breath and difficulty breathing.   Cardiovascular: Negative for chest pain, palpitations, hypertension, irregular heartbeat and swelling in legs/feet.  Gastrointestinal: Negative for blood in stool, constipation and diarrhea.  Endocrine: Negative for increased urination.  Genitourinary: Negative for difficulty urinating and vaginal dryness.  Musculoskeletal: Positive for arthralgias, joint pain, joint swelling, muscle weakness, morning stiffness and muscle tenderness. Negative for myalgias  and myalgias.  Skin: Negative for color change, rash, hair loss, skin tightness, ulcers and sensitivity to sunlight.  Allergic/Immunologic: Negative for susceptible to infections.  Neurological: Negative for dizziness, memory loss, night sweats and weakness.  Hematological: Positive for bruising/bleeding tendency. Negative for swollen glands.  Psychiatric/Behavioral: Negative for depressed mood and sleep disturbance. The patient is not nervous/anxious.     PMFS History:  Patient Active Problem List   Diagnosis Date Noted  . Hyperlipidemia 08/10/2016  . GAD (generalized anxiety disorder) 08/04/2016  . History of autoimmune disorder 03/20/2016  . Open leg wound, right, sequela 03/20/2016  . Drug therapy 05/10/2015  . Abnormal glucose 05/09/2015  . Acute frontal sinusitis 05/09/2015  . Allergic conjunctivitis 05/09/2015  . Amenorrhea 05/09/2015  . Allergic rhinitis 05/09/2015  . Benign essential hypertension 05/09/2015  . Depression 05/09/2015  . Foot arch pain 05/09/2015  . Edema 05/09/2015  . Hiatal hernia with GERD without esophagitis 05/09/2015  . Muscle spasm 05/09/2015  . Pain in joint involving multiple sites 05/09/2015  . Psoriasis 05/09/2015  . Menorrhagia with irregular cycle 04/10/2015  . Hammer toe of right foot 04/07/2015  . Pain from implanted hardware 04/07/2015    Past Medical History:  Diagnosis Date  . Arthritis   . Diabetes mellitus without complication ([place])   . Hypertension     Family History  Problem Relation Age of Onset  . Hypertension Mother   . Diabetes Father   . COPD Father   . Hypertension Father   . Cancer Sister   . High Cholesterol Brother   . Crohn's disease Son    Past  Surgical History:  Procedure Laterality Date  . BUNIONECTOMY Bilateral 2007  . CARPAL TUNNEL RELEASE Right 07/2019  . REPLACEMENT TOTAL KNEE Left 03/2019  . REPLACEMENT TOTAL KNEE Right 12/2018  . TOE SURGERY Right    great toe x3  . TONSILLECTOMY AND ADENOIDECTOMY     . TYMPANOSTOMY TUBE PLACEMENT    . WISDOM TOOTH EXTRACTION     81s   Social History   Social History Narrative   ** Merged History Encounter **       Immunization History  Administered Date(s) Administered  . PFIZER(Purple Top)SARS-COV-2 Vaccination 11/24/2019, 12/15/2019     Objective: Vital Signs: BP 117/71 (BP Location: Left Arm, Patient Position: Sitting, Cuff Size: Normal)   Pulse 65   Resp 16   Ht 5\' 4"  (1.626 m)   Wt 217 lb (98.4 kg)   BMI 37.25 kg/m    Physical Exam Vitals and nursing note reviewed.  Constitutional:      Appearance: She is well-developed.  HENT:     Head: Normocephalic and atraumatic.  Eyes:     Conjunctiva/sclera: Conjunctivae normal.  Cardiovascular:     Rate and Rhythm: Normal rate and regular rhythm.     Heart sounds: Normal heart sounds.  Pulmonary:     Effort: Pulmonary effort is normal.     Breath sounds: Normal breath sounds.  Abdominal:     General: Bowel sounds are normal.     Palpations: Abdomen is soft.  Musculoskeletal:     Cervical back: Normal range of motion.  Lymphadenopathy:     Cervical: No cervical adenopathy.  Skin:    General: Skin is warm and dry.     Capillary Refill: Capillary refill takes less than 2 seconds.  Neurological:     Mental Status: She is alert and oriented to person, place, and time.  Psychiatric:        Behavior: Behavior normal.      Musculoskeletal Exam: C-spine was in good range of motion.  Thoracic and lumbar spine was in good range of motion.  She had tenderness on palpation over left SI joint.  Shoulder joints, elbow joints, wrist joints with good range of motion.  She has synovitis over bilateral first second and third MCP joints her right fourth PIP joint and right second and third DIP joints.  She had synovitis in her left fourth toe DIP.  CDAI Exam: CDAI Score: 12.9  Patient Global: 4 mm; Provider Global: 5 mm Swollen: 9 ; Tender: 11  Joint Exam 06/13/2020      Right  Left   MCP 1     Swollen Tender  MCP 2  Swollen Tender  Swollen Tender  MCP 3  Swollen Tender  Swollen Tender  IP  Swollen Tender     DIP 3  Swollen Tender     DIP 4  Swollen Tender     Sacroiliac   Tender   Tender  DIP 4 (toe)     Swollen Tender     Investigation: No additional findings.  Imaging: No results found.  Recent Labs: Lab Results  Component Value Date   WBC 9.1 05/02/2020   HGB 15.3 05/02/2020   PLT 282 05/02/2020   NA 139 05/02/2020   K 3.9 05/02/2020   CL 100 05/02/2020   CO2 23 05/02/2020   GLUCOSE 94 05/02/2020   BUN 20 05/02/2020   CREATININE 0.67 05/02/2020   BILITOT 0.4 05/02/2020   AST 20 05/02/2020   ALT 20 05/02/2020  PROT 7.0 05/02/2020   PROT 7.0 05/02/2020   CALCIUM 9.0 05/02/2020   GFRAA 118 05/02/2020   QFTBGOLDPLUS NEGATIVE 05/02/2020   03/28 2022 IFE normal, immunoglobulins normal, TB Gold negative, hepatitis B-, hepatitis C negative, HIV negative, CK110, ESR 17, RF negative, anti-CCP negative, ANA negative  Speciality Comments: MTX since2004, Humira-2004-2017-quit working, cosyntex 2018x 8 months, restarted May 11, 2020  Procedures:  Sacroiliac Joint Inj on 06/13/2020 12:12 PM Indications: pain Details: 27 G 1.5 in needle, posterior approach Medications: 40 mg triamcinolone acetonide 40 MG/ML; 1.5 mL lidocaine 1 % Aspirate: 0 mL Outcome: tolerated well, no immediate complications Procedure, treatment alternatives, risks and benefits explained, specific risks discussed. Consent was given by the patient. Immediately prior to procedure a time out was called to verify the correct patient, procedure, equipment, support staff and site/side marked as required. Patient was prepped and draped in the usual sterile fashion.     Allergies: Patient has no known allergies.   Assessment / Plan:     Visit Diagnoses: Psoriatic arthritis ([place]) - Dxd in 2004 by Dr. Rockwell [place].  She has been on methotrexate since 2004.  Humira 2004 01/2016, Cosentyx 2018  for 8 months.  Cosentyx was restarted on May 11, 2020.  Patient states she has noticed improvement in pain and swelling.  She is overall quite pleased with the combination therapy.  As noted synovitis in multiple joints and joint deformity from prior damage.  I would like to see the response to Cosentyx over the next 3 months.  I also discussed switching to subcu methotrexate at the follow-up visit if she has persistent symptoms.  Labs done during the last visit were reviewed.  Psoriasis - in the inguinal region.  Patient reports improvement in the rash.  High risk medication use - Methotrexate 10 tablets p.o. weekly, folic acid 2 mg p.o. daily, Cosentyx 300 mg subcu monthly.  Cosyntex restarted on May 11, 2020.- Plan: CBC with Differential/Platelet, COMPLETE METABOLIC PANEL WITH GFR today and then every 3 months to monitor for drug toxicity.  Pain in both hands - She had active synovitis and radiographic findings consistent with psoriatic arthritis.  She still has ongoing synovitis in her bilateral hands.  Bilateral sacroiliitis ([facility]) - Bilateral SI joint sclerosis was noted on the x-rays.  She has chronic SI joint pain.  She had increased pain in her left SI joint to the point she is having difficulty walking.  After informed consent was obtained and different treatment options were discussed left SI joint was injected with cortisone as described above.  She tolerated the procedure well.  Status post total knee replacement, bilateral - right total knee replacement November 2020 and left total knee replacement February 2021 by Dr. Laurance Flatten at [facility].   Pain in both feet - X-rays were consistent with psoriatic arthritis.  She continues to have swelling in her left fourth toe.  Benign essential hypertension-blood pressure is controlled.  Mixed hyperlipidemia-need for regular exercise and dietary modifications were discussed.  Prediabetes  Hiatal hernia with GERD without esophagitis  GAD  (generalized anxiety disorder)  Family history of Crohn's disease - son  Encounter for screening colonoscopy - Patient was referred to gastroenterology for colonoscopy.  Patient did not respond to the GI .  Orders: Orders Placed This Encounter  Procedures  . Sacroiliac Joint Inj  . CBC with Differential/Platelet  . COMPLETE METABOLIC PANEL WITH GFR   No orders of the defined types were placed in this encounter.    Follow-Up Instructions:  Return in about 3 months (around 09/13/2020) for Psoriatic arthritis.   Bo Merino, MD  Note - This record has been created using Editor, commissioning.  Chart creation errors have been sought, but may not always  have been located. Such creation errors do not reflect on  the standard of medical care.

## 2020-06-07 ENCOUNTER — Ambulatory Visit: Payer: Self-pay | Admitting: Rheumatology

## 2020-06-13 ENCOUNTER — Encounter: Payer: Self-pay | Admitting: Rheumatology

## 2020-06-13 ENCOUNTER — Ambulatory Visit (INDEPENDENT_AMBULATORY_CARE_PROVIDER_SITE_OTHER): Payer: Medicare Other | Admitting: Rheumatology

## 2020-06-13 ENCOUNTER — Other Ambulatory Visit: Payer: Self-pay

## 2020-06-13 VITALS — BP 117/71 | HR 65 | Resp 16 | Ht 64.0 in | Wt 217.0 lb

## 2020-06-13 DIAGNOSIS — Z79899 Other long term (current) drug therapy: Secondary | ICD-10-CM

## 2020-06-13 DIAGNOSIS — L409 Psoriasis, unspecified: Secondary | ICD-10-CM

## 2020-06-13 DIAGNOSIS — M79641 Pain in right hand: Secondary | ICD-10-CM

## 2020-06-13 DIAGNOSIS — M461 Sacroiliitis, not elsewhere classified: Secondary | ICD-10-CM

## 2020-06-13 DIAGNOSIS — L4050 Arthropathic psoriasis, unspecified: Secondary | ICD-10-CM | POA: Diagnosis not present

## 2020-06-13 DIAGNOSIS — K219 Gastro-esophageal reflux disease without esophagitis: Secondary | ICD-10-CM

## 2020-06-13 DIAGNOSIS — Z96653 Presence of artificial knee joint, bilateral: Secondary | ICD-10-CM

## 2020-06-13 DIAGNOSIS — Z8379 Family history of other diseases of the digestive system: Secondary | ICD-10-CM

## 2020-06-13 DIAGNOSIS — M79671 Pain in right foot: Secondary | ICD-10-CM

## 2020-06-13 DIAGNOSIS — R7303 Prediabetes: Secondary | ICD-10-CM

## 2020-06-13 DIAGNOSIS — M79642 Pain in left hand: Secondary | ICD-10-CM

## 2020-06-13 DIAGNOSIS — Z1211 Encounter for screening for malignant neoplasm of colon: Secondary | ICD-10-CM

## 2020-06-13 DIAGNOSIS — E782 Mixed hyperlipidemia: Secondary | ICD-10-CM

## 2020-06-13 DIAGNOSIS — F411 Generalized anxiety disorder: Secondary | ICD-10-CM

## 2020-06-13 DIAGNOSIS — M79672 Pain in left foot: Secondary | ICD-10-CM

## 2020-06-13 DIAGNOSIS — K449 Diaphragmatic hernia without obstruction or gangrene: Secondary | ICD-10-CM

## 2020-06-13 DIAGNOSIS — I10 Essential (primary) hypertension: Secondary | ICD-10-CM

## 2020-06-13 MED ORDER — LIDOCAINE HCL 1 % IJ SOLN
1.5000 mL | INTRAMUSCULAR | Status: AC | PRN
  Administered 2020-06-13: 1.5 mL

## 2020-06-13 MED ORDER — TRIAMCINOLONE ACETONIDE 40 MG/ML IJ SUSP
40.0000 mg | INTRAMUSCULAR | Status: AC | PRN
Start: 2020-06-13 — End: 2020-06-13
  Administered 2020-06-13: 40 mg via INTRA_ARTICULAR

## 2020-06-13 NOTE — Patient Instructions (Signed)
Standing Labs We placed an order today for your standing lab work.   Please have your standing labs drawn in August and every 3 months   If possible, please have your labs drawn 2 weeks prior to your appointment so that the provider can discuss your results at your appointment.  We have open lab daily Monday through Thursday from 1:30-4:30 PM and Friday from 1:30-4:00 PM at the office of Dr. Harvis Mabus, [facility].   Please be advised, all patients with office appointments requiring lab work will take precedents over walk-in lab work.  If possible, please come for your lab work on Monday and Friday afternoons, as you may experience shorter wait times. The office is located at [address], [address], [place], [place] [address] No appointment is necessary.   Labs are drawn by [facility]. Please bring your co-pay at the time of your lab draw.  You may receive a bill from [facility] for your lab work.  If you wish to have your labs drawn at another location, please call the office 24 hours in advance to send orders.  If you have any questions regarding directions or hours of operation,  please call [phone].   As a reminder, please drink plenty of water prior to coming for your lab work. Thanks!   

## 2020-06-14 LAB — COMPLETE METABOLIC PANEL WITH GFR
AG Ratio: 1.9 (calc) (ref 1.0–2.5)
ALT: 17 U/L (ref 6–29)
AST: 17 U/L (ref 10–35)
Albumin: 4.6 g/dL (ref 3.6–5.1)
Alkaline phosphatase (APISO): 68 U/L (ref 37–153)
BUN: 20 mg/dL (ref 7–25)
CO2: 30 mmol/L (ref 20–32)
Calcium: 9.2 mg/dL (ref 8.6–10.4)
Chloride: 103 mmol/L (ref 98–110)
Creat: 0.76 mg/dL (ref 0.50–1.05)
GFR, Est African American: 105 mL/min/{1.73_m2} (ref >=60)
GFR, Est Non African American: 91 mL/min/{1.73_m2} (ref >=60)
Globulin: 2.4 g/dL (calc) (ref 1.9–3.7)
Glucose, Bld: 105 mg/dL — ABNORMAL HIGH (ref 65–99)
Potassium: 4.6 mmol/L (ref 3.5–5.3)
Sodium: 140 mmol/L (ref 135–146)
Total Bilirubin: 0.4 mg/dL (ref 0.2–1.2)
Total Protein: 7 g/dL (ref 6.1–8.1)

## 2020-06-14 LAB — CBC WITH DIFFERENTIAL/PLATELET
Absolute Monocytes: 636 cells/uL (ref 200–950)
Basophils Absolute: 52 cells/uL (ref 0–200)
Basophils Relative: 0.6 %
Eosinophils Absolute: 473 cells/uL (ref 15–500)
Eosinophils Relative: 5.5 %
HCT: 45.9 % — ABNORMAL HIGH (ref 35.0–45.0)
Hemoglobin: 15.6 g/dL — ABNORMAL HIGH (ref 11.7–15.5)
Lymphs Abs: 2623 cells/uL (ref 850–3900)
MCH: 33.3 pg — ABNORMAL HIGH (ref 27.0–33.0)
MCHC: 34 g/dL (ref 32.0–36.0)
MCV: 98.1 fL (ref 80.0–100.0)
MPV: 10.3 fL (ref 7.5–12.5)
Monocytes Relative: 7.4 %
Neutro Abs: 4816 cells/uL (ref 1500–7800)
Neutrophils Relative %: 56 %
Platelets: 261 10*3/uL (ref 140–400)
RBC: 4.68 10*6/uL (ref 3.80–5.10)
RDW: 13.1 % (ref 11.0–15.0)
Total Lymphocyte: 30.5 %
WBC: 8.6 10*3/uL (ref 3.8–10.8)

## 2020-06-14 NOTE — Progress Notes (Signed)
CBC and CMP are stable.

## 2020-08-29 ENCOUNTER — Other Ambulatory Visit: Payer: Self-pay | Admitting: *Deleted

## 2020-08-29 DIAGNOSIS — L409 Psoriasis, unspecified: Secondary | ICD-10-CM

## 2020-08-29 DIAGNOSIS — L4050 Arthropathic psoriasis, unspecified: Secondary | ICD-10-CM

## 2020-08-29 MED ORDER — COSENTYX SENSOREADY (300 MG) 150 MG/ML ~~LOC~~ SOAJ: 300.0000 mg | SUBCUTANEOUS | 0 refills | Status: DC

## 2020-08-29 NOTE — Telephone Encounter (Signed)
Next Visit: 09/16/2020  Last Visit: 06/13/2020  Last Fill: 07/06/2020  GG:[id number] arthritis  Current Dose per office note 06/13/2020: Cosentyx 300 mg subcu monthly  Labs: 06/13/2020, CBC and CMP are stable.  TB Gold: 05/02/2020,  negative  Okay to refill Cosentyx?

## 2020-09-02 NOTE — Progress Notes (Deleted)
Office Visit Note  Patient: Sherry Stein             Date of Birth: August 18, 1970           MRN: [id number]             PCP: Barbarann Ehlers Referring: Barbarann Ehlers Visit Date: 09/16/2020 Occupation: @GUAROCC @  Subjective:  No chief complaint on file.   History of Present Illness: Sherry Stein is a 50 y.o. female ***   Activities of Daily Living:  Patient reports morning stiffness for *** {minute/hour:19697}.   Patient {ACTIONS;DENIES/REPORTS:21021675::"Denies"} nocturnal pain.  Difficulty dressing/grooming: {ACTIONS;DENIES/REPORTS:21021675::"Denies"} Difficulty climbing stairs: {ACTIONS;DENIES/REPORTS:21021675::"Denies"} Difficulty getting out of chair: {ACTIONS;DENIES/REPORTS:21021675::"Denies"} Difficulty using hands for taps, buttons, cutlery, and/or writing: {ACTIONS;DENIES/REPORTS:21021675::"Denies"}  No Rheumatology ROS completed.   PMFS History:  Patient Active Problem List   Diagnosis Date Noted   Hyperlipidemia 08/10/2016   GAD (generalized anxiety disorder) 08/04/2016   History of autoimmune disorder 03/20/2016   Open leg wound, right, sequela 03/20/2016   Drug therapy 05/10/2015   Abnormal glucose 05/09/2015   Acute frontal sinusitis 05/09/2015   Allergic conjunctivitis 05/09/2015   Amenorrhea 05/09/2015   Allergic rhinitis 05/09/2015   Benign essential hypertension 05/09/2015   Depression 05/09/2015   Foot arch pain 05/09/2015   Edema 05/09/2015   Hiatal hernia with GERD without esophagitis 05/09/2015   Muscle spasm 05/09/2015   Pain in joint involving multiple sites 05/09/2015   Psoriasis 05/09/2015   Menorrhagia with irregular cycle 04/10/2015   Hammer toe of right foot 04/07/2015   Pain from implanted hardware 04/07/2015    Past Medical History:  Diagnosis Date   Arthritis    Diabetes mellitus without complication (HCC)    Hypertension     Family History  Problem Relation Age of Onset   Hypertension Mother     Diabetes Father    COPD Father    Hypertension Father    Cancer Sister    High Cholesterol Brother    Crohn's disease Son    Past Surgical History:  Procedure Laterality Date   BUNIONECTOMY Bilateral 2007   CARPAL TUNNEL RELEASE Right 07/2019   REPLACEMENT TOTAL KNEE Left 03/2019   REPLACEMENT TOTAL KNEE Right 12/2018   TOE SURGERY Right    great toe x3   TONSILLECTOMY AND ADENOIDECTOMY     TYMPANOSTOMY TUBE PLACEMENT     WISDOM TOOTH EXTRACTION     20s   Social History   Social History Narrative   ** Merged History Encounter **       Immunization History  Administered Date(s) Administered   PFIZER(Purple Top)SARS-COV-2 Vaccination 11/24/2019, 12/15/2019     Objective: Vital Signs: There were no vitals taken for this visit.   Physical Exam   Musculoskeletal Exam: ***  CDAI Exam: CDAI Score: -- Patient Global: --; Provider Global: -- Swollen: --; Tender: -- Joint Exam 09/16/2020   No joint exam has been documented for this visit   There is currently no information documented on the homunculus. Go to the Rheumatology activity and complete the homunculus joint exam.  Investigation: No additional findings.  Imaging: No results found.  Recent Labs: Lab Results  Component Value Date   WBC 8.6 06/13/2020   HGB 15.6 (H) 06/13/2020   PLT 261 06/13/2020   NA 140 06/13/2020   K 4.6 06/13/2020   CL 103 06/13/2020   CO2 30 06/13/2020   GLUCOSE 105 (H) 06/13/2020   BUN 20 06/13/2020   CREATININE 0.76  06/13/2020   BILITOT 0.4 06/13/2020   AST 17 06/13/2020   ALT 17 06/13/2020   PROT 7.0 06/13/2020   CALCIUM 9.2 06/13/2020   GFRAA 105 06/13/2020   QFTBGOLDPLUS NEGATIVE 05/02/2020    Speciality Comments: MTX since2004, Humira-2004-2017-quit working, cosyntex 2018x 8 months, restarted May 11, 2020  Procedures:  No procedures performed Allergies: Patient has no known allergies.   Assessment / Plan:     Visit Diagnoses: Psoriatic arthritis  (HCC)  Psoriasis  High risk medication use  Bilateral sacroiliitis (HCC)  Status post total knee replacement, bilateral  Benign essential hypertension  Mixed hyperlipidemia  Prediabetes  Hiatal hernia with GERD without esophagitis  GAD (generalized anxiety disorder)  Family history of Crohn's disease  Hammer toes of both feet  Orders: No orders of the defined types were placed in this encounter.  No orders of the defined types were placed in this encounter.   Face-to-face time spent with patient was *** minutes. Greater than 50% of time was spent in counseling and coordination of care.  Follow-Up Instructions: No follow-ups on file.   Gearldine Bienenstock, PA-C  Note - This record has been created using Dragon software.  Chart creation errors have been sought, but may not always  have been located. Such creation errors do not reflect on  the standard of medical care.

## 2020-09-16 ENCOUNTER — Ambulatory Visit: Payer: Medicare Other | Admitting: Rheumatology

## 2020-09-16 DIAGNOSIS — R7303 Prediabetes: Secondary | ICD-10-CM

## 2020-09-16 DIAGNOSIS — M2041 Other hammer toe(s) (acquired), right foot: Secondary | ICD-10-CM

## 2020-09-16 DIAGNOSIS — E782 Mixed hyperlipidemia: Secondary | ICD-10-CM

## 2020-09-16 DIAGNOSIS — L409 Psoriasis, unspecified: Secondary | ICD-10-CM

## 2020-09-16 DIAGNOSIS — Z79899 Other long term (current) drug therapy: Secondary | ICD-10-CM

## 2020-09-16 DIAGNOSIS — Z8379 Family history of other diseases of the digestive system: Secondary | ICD-10-CM

## 2020-09-16 DIAGNOSIS — L4050 Arthropathic psoriasis, unspecified: Secondary | ICD-10-CM

## 2020-09-16 DIAGNOSIS — I10 Essential (primary) hypertension: Secondary | ICD-10-CM

## 2020-09-16 DIAGNOSIS — M461 Sacroiliitis, not elsewhere classified: Secondary | ICD-10-CM

## 2020-09-16 DIAGNOSIS — Z96653 Presence of artificial knee joint, bilateral: Secondary | ICD-10-CM

## 2020-09-16 DIAGNOSIS — K219 Gastro-esophageal reflux disease without esophagitis: Secondary | ICD-10-CM

## 2020-09-16 DIAGNOSIS — F411 Generalized anxiety disorder: Secondary | ICD-10-CM

## 2020-09-16 NOTE — Progress Notes (Signed)
Office Visit Note  Patient: Sherry Stein             Date of Birth: 1970-05-31           MRN: [id number]             PCP: Barbarann Ehlers Referring: Jordan Hawks, PA-C Visit Date: 09/20/2020 Occupation: @GUAROCC @  Subjective:  Pain in multiple joints   History of Present Illness: Sherry Stein is a 50 y.o. female with history of psoriatic arthritis and osteoarthritis.  She states she is doing overall much better since she has been taking Cosentyx along with methotrexate.  She takes methotrexate 5 tablets twice a week and Cosentyx injections every 28 days.  She continues to have some stiffness in her hands and her knees.  She also has difficulty walking due to severe arthritis in her feet.  She wanted to handicap placard filled which she uses when needed.  She states usually the summer months are better for her than winter months.  Activities of Daily Living:  Patient reports morning stiffness for 2-3 hours.   Patient Denies nocturnal pain.  Difficulty dressing/grooming: Denies Difficulty climbing stairs: Denies Difficulty getting out of chair: Denies Difficulty using hands for taps, buttons, cutlery, and/or writing: Reports  Review of Systems  Constitutional:  Negative for fatigue.  HENT:  Negative for mouth sores, mouth dryness and nose dryness.   Eyes:  Negative for pain, itching and dryness.  Respiratory:  Negative for shortness of breath and difficulty breathing.   Cardiovascular:  Negative for chest pain and palpitations.  Gastrointestinal:  Negative for blood in stool, constipation and diarrhea.  Endocrine: Negative for increased urination.  Genitourinary:  Negative for difficulty urinating.  Musculoskeletal:  Positive for joint pain, joint pain, joint swelling, myalgias, morning stiffness, muscle tenderness and myalgias.  Skin:  Negative for color change, rash, redness and sensitivity to sunlight.  Allergic/Immunologic: Negative for  susceptible to infections.  Neurological:  Negative for dizziness, numbness, headaches, memory loss and weakness.  Hematological:  Positive for bruising/bleeding tendency.  Psychiatric/Behavioral:  Negative for depressed mood and confusion. The patient is not nervous/anxious.    PMFS History:  Patient Active Problem List   Diagnosis Date Noted   Hyperlipidemia 08/10/2016   GAD (generalized anxiety disorder) 08/04/2016   History of autoimmune disorder 03/20/2016   Open leg wound, right, sequela 03/20/2016   Drug therapy 05/10/2015   Abnormal glucose 05/09/2015   Acute frontal sinusitis 05/09/2015   Allergic conjunctivitis 05/09/2015   Amenorrhea 05/09/2015   Allergic rhinitis 05/09/2015   Benign essential hypertension 05/09/2015   Depression 05/09/2015   Foot arch pain 05/09/2015   Edema 05/09/2015   Hiatal hernia with GERD without esophagitis 05/09/2015   Muscle spasm 05/09/2015   Pain in joint involving multiple sites 05/09/2015   Psoriasis 05/09/2015   Menorrhagia with irregular cycle 04/10/2015   Hammer toe of right foot 04/07/2015   Pain from implanted hardware 04/07/2015    Past Medical History:  Diagnosis Date   Arthritis    Diabetes mellitus without complication (HCC)    Hypertension     Family History  Problem Relation Age of Onset   Hypertension Mother    Diabetes Father    COPD Father    Hypertension Father    Cancer Sister    High Cholesterol Brother    Crohn's disease Son    Past Surgical History:  Procedure Laterality Date   BUNIONECTOMY Bilateral 2007   CARPAL TUNNEL  RELEASE Right 07/2019   REPLACEMENT TOTAL KNEE Left 03/2019   REPLACEMENT TOTAL KNEE Right 12/2018   TOE SURGERY Right    great toe x3   TONSILLECTOMY AND ADENOIDECTOMY     TYMPANOSTOMY TUBE PLACEMENT     WISDOM TOOTH EXTRACTION     20s   Social History   Social History Narrative   ** Merged History Encounter **       Immunization History  Administered Date(s) Administered    PFIZER(Purple Top)SARS-COV-2 Vaccination 11/24/2019, 12/15/2019     Objective: Vital Signs: BP 125/84 (BP Location: Left Arm, Patient Position: Sitting, Cuff Size: Large)   Pulse 64   Resp 13   Ht 5\' 4"  (1.626 m)   Wt 219 lb 3.2 oz (99.4 kg)   BMI 37.63 kg/m    Physical Exam Vitals and nursing note reviewed.  Constitutional:      Appearance: She is well-developed.  HENT:     Head: Normocephalic and atraumatic.  Eyes:     Conjunctiva/sclera: Conjunctivae normal.  Cardiovascular:     Rate and Rhythm: Normal rate and regular rhythm.     Heart sounds: Normal heart sounds.  Pulmonary:     Effort: Pulmonary effort is normal.     Breath sounds: Normal breath sounds.  Abdominal:     General: Bowel sounds are normal.     Palpations: Abdomen is soft.  Musculoskeletal:     Cervical back: Normal range of motion.  Lymphadenopathy:     Cervical: No cervical adenopathy.  Skin:    General: Skin is warm and dry.     Capillary Refill: Capillary refill takes less than 2 seconds.  Neurological:     Mental Status: She is alert and oriented to person, place, and time.  Psychiatric:        Behavior: Behavior normal.     Musculoskeletal Exam: C-spine was in good range of motion.  Shoulder joints, elbow joints, wrist joints with good range of motion.  She has bilateral PIP and DIP thickening and right second and third MCP thickening.  Hip joints with good range of motion.  Both knee joints are replaced.  She had effusion in her right knee joint.  There was no tenderness over ankles.  She had bilateral hammertoes and synovial thickening.  CDAI Exam: CDAI Score: -- Patient Global: --; Provider Global: -- Swollen: 0 ; Tender: 0  Joint Exam 09/20/2020   No joint exam has been documented for this visit   There is currently no information documented on the homunculus. Go to the Rheumatology activity and complete the homunculus joint exam.  Investigation: No additional  findings.  Imaging: No results found.  Recent Labs: Lab Results  Component Value Date   WBC 8.6 06/13/2020   HGB 15.6 (H) 06/13/2020   PLT 261 06/13/2020   NA 140 06/13/2020   K 4.6 06/13/2020   CL 103 06/13/2020   CO2 30 06/13/2020   GLUCOSE 105 (H) 06/13/2020   BUN 20 06/13/2020   CREATININE 0.76 06/13/2020   BILITOT 0.4 06/13/2020   AST 17 06/13/2020   ALT 17 06/13/2020   PROT 7.0 06/13/2020   CALCIUM 9.2 06/13/2020   GFRAA 105 06/13/2020   QFTBGOLDPLUS NEGATIVE 05/02/2020    Speciality Comments: MTX since2004, Humira-2004-2017-quit working, cosyntex 2018x 8 months, restarted May 11, 2020   FU every 3 months  Procedures:  No procedures performed Allergies: Patient has no known allergies.   Assessment / Plan:     Visit Diagnoses: Psoriatic  arthritis (HCC) - Dxd in 2004 by Dr. Coral Spikes.  She has been on methotrexate since 2004.  Humira 2004 01/2016, Cosentyx 2018 for 8 months.  She restarted Cosentyx on July 06, 2020.  She states she is doing much better on the combination therapy.  She continues to have some synovial thickening and joint deformities due to previous damage.  Psoriasis - in the inguinal region.  She states her psoriasis lesions are improved.  High risk medication use - Methotrexate 10 tablets p.o. weekly, folic acid 2 mg p.o. daily, Cosentyx 300 mg subcu monthly.  She will get labs today and then every 3 months to monitor for drug toxicity.  She we will get labs today and then every 3 months to monitor for drug toxicity.  Information regarding immunization was placed in the AVS.  She is aware to delay methotrexate for a week after COVID-19 booster.  She is also advised to stop Cosentyx and methotrexate in case she develops an infection and restart after infection resolves.  Pain in both hands-she has severe psoriatic arthritis and osteoarthritis overlap and deformities in her hands with decreased grip strength.  Bilateral sacroiliitis (HCC) - Bilateral SI  joint sclerosis was noted on the x-rays.  Status post total knee replacement, bilateral - right total knee replacement November 2020 and left total knee replacement February 2021 by Dr. Christell Constant at [facility].  She continues to have her right knee joint effusion.  Pain in both feet - X-rays were consistent with psoriatic arthritis.  She has severe psoriatic arthritis in her feet and chronic discomfort.  A handicap placard  form was filled.  Benign essential hypertension-blood pressure is normal.  Mixed hyperlipidemia-increased risk of heart disease with psoriatic arthritis was discussed.  Dietary modifications and exercise were placed in the AVS.  Hiatal hernia with GERD without esophagitis  Prediabetes  GAD (generalized anxiety disorder)  Family history of Crohn's disease -  son  Encounter for screening colonoscopy - Patient was referred to gastroenterology for colonoscopy.  Patient did not respond to the GI .  Smoker-a handout regarding smoking cessation was also placed in the AVS.  She smokes three-quarter pack per day for last 32 years.  Orders: Orders Placed This Encounter  Procedures   COMPLETE METABOLIC PANEL WITH GFR   CBC with Differential/Platelet    No orders of the defined types were placed in this encounter.   Follow-Up Instructions: Return in about 3 months (around 12/21/2020) for Psoriatic arthritis.   Pollyann Savoy, MD  Note - This record has been created using Animal nutritionist.  Chart creation errors have been sought, but may not always  have been located. Such creation errors do not reflect on  the standard of medical care.

## 2020-09-20 ENCOUNTER — Encounter: Payer: Self-pay | Admitting: Rheumatology

## 2020-09-20 ENCOUNTER — Other Ambulatory Visit: Payer: Self-pay

## 2020-09-20 ENCOUNTER — Ambulatory Visit (INDEPENDENT_AMBULATORY_CARE_PROVIDER_SITE_OTHER): Payer: Medicare Other | Admitting: Rheumatology

## 2020-09-20 VITALS — BP 125/84 | HR 64 | Resp 13 | Ht 64.0 in | Wt 219.2 lb

## 2020-09-20 DIAGNOSIS — M79672 Pain in left foot: Secondary | ICD-10-CM

## 2020-09-20 DIAGNOSIS — M79641 Pain in right hand: Secondary | ICD-10-CM

## 2020-09-20 DIAGNOSIS — M79671 Pain in right foot: Secondary | ICD-10-CM

## 2020-09-20 DIAGNOSIS — I10 Essential (primary) hypertension: Secondary | ICD-10-CM

## 2020-09-20 DIAGNOSIS — K449 Diaphragmatic hernia without obstruction or gangrene: Secondary | ICD-10-CM

## 2020-09-20 DIAGNOSIS — R7303 Prediabetes: Secondary | ICD-10-CM

## 2020-09-20 DIAGNOSIS — F411 Generalized anxiety disorder: Secondary | ICD-10-CM

## 2020-09-20 DIAGNOSIS — L409 Psoriasis, unspecified: Secondary | ICD-10-CM | POA: Diagnosis not present

## 2020-09-20 DIAGNOSIS — L4050 Arthropathic psoriasis, unspecified: Secondary | ICD-10-CM | POA: Diagnosis not present

## 2020-09-20 DIAGNOSIS — M79642 Pain in left hand: Secondary | ICD-10-CM

## 2020-09-20 DIAGNOSIS — Z8379 Family history of other diseases of the digestive system: Secondary | ICD-10-CM

## 2020-09-20 DIAGNOSIS — Z79899 Other long term (current) drug therapy: Secondary | ICD-10-CM

## 2020-09-20 DIAGNOSIS — K219 Gastro-esophageal reflux disease without esophagitis: Secondary | ICD-10-CM

## 2020-09-20 DIAGNOSIS — M461 Sacroiliitis, not elsewhere classified: Secondary | ICD-10-CM

## 2020-09-20 DIAGNOSIS — Z96653 Presence of artificial knee joint, bilateral: Secondary | ICD-10-CM

## 2020-09-20 DIAGNOSIS — Z1211 Encounter for screening for malignant neoplasm of colon: Secondary | ICD-10-CM

## 2020-09-20 DIAGNOSIS — F17200 Nicotine dependence, unspecified, uncomplicated: Secondary | ICD-10-CM

## 2020-09-20 DIAGNOSIS — E782 Mixed hyperlipidemia: Secondary | ICD-10-CM

## 2020-09-20 LAB — COMPLETE METABOLIC PANEL WITH GFR
AG Ratio: 1.9 (calc) (ref 1.0–2.5)
ALT: 15 U/L (ref 6–29)
AST: 14 U/L (ref 10–35)
Albumin: 4.6 g/dL (ref 3.6–5.1)
Alkaline phosphatase (APISO): 66 U/L (ref 37–153)
BUN: 20 mg/dL (ref 7–25)
CO2: 31 mmol/L (ref 20–32)
Calcium: 9.5 mg/dL (ref 8.6–10.4)
Chloride: 101 mmol/L (ref 98–110)
Creat: 0.66 mg/dL (ref 0.50–1.03)
Globulin: 2.4 g/dL (calc) (ref 1.9–3.7)
Glucose, Bld: 95 mg/dL (ref 65–99)
Potassium: 4.5 mmol/L (ref 3.5–5.3)
Sodium: 140 mmol/L (ref 135–146)
Total Bilirubin: 0.5 mg/dL (ref 0.2–1.2)
Total Protein: 7 g/dL (ref 6.1–8.1)
eGFR: 106 mL/min/{1.73_m2} (ref >=60)

## 2020-09-20 LAB — CBC WITH DIFFERENTIAL/PLATELET
Absolute Monocytes: 700 cells/uL (ref 200–950)
Basophils Absolute: 64 cells/uL (ref 0–200)
Basophils Relative: 0.6 %
Eosinophils Absolute: 445 cells/uL (ref 15–500)
Eosinophils Relative: 4.2 %
HCT: 46.4 % — ABNORMAL HIGH (ref 35.0–45.0)
Hemoglobin: 15.6 g/dL — ABNORMAL HIGH (ref 11.7–15.5)
Lymphs Abs: 2894 cells/uL (ref 850–3900)
MCH: 32.7 pg (ref 27.0–33.0)
MCHC: 33.6 g/dL (ref 32.0–36.0)
MCV: 97.3 fL (ref 80.0–100.0)
MPV: 10.1 fL (ref 7.5–12.5)
Monocytes Relative: 6.6 %
Neutro Abs: 6498 cells/uL (ref 1500–7800)
Neutrophils Relative %: 61.3 %
Platelets: 297 10*3/uL (ref 140–400)
RBC: 4.77 10*6/uL (ref 3.80–5.10)
RDW: 12.7 % (ref 11.0–15.0)
Total Lymphocyte: 27.3 %
WBC: 10.6 10*3/uL (ref 3.8–10.8)

## 2020-09-20 NOTE — Patient Instructions (Addendum)
Standing Labs We placed an order today for your standing lab work.   Please have your standing labs drawn in November and every 3 months  If possible, please have your labs drawn 2 weeks prior to your appointment so that the provider can discuss your results at your appointment.  Please note that you may see your imaging and lab results in [place] before we have reviewed them. We may be awaiting multiple results to interpret others before contacting you. Please allow our office up to 72 hours to thoroughly review all of the results before contacting the office for clarification of your results.  We have open lab daily: Monday through Thursday from 1:30-4:30 PM and Friday from 1:30-4:00 PM at the office of Dr. Bo Merino, [facility].   Please be advised, all patients with office appointments requiring lab work will take precedent over walk-in lab work.  If possible, please come for your lab work on Monday and Friday afternoons, as you may experience shorter wait times. The office is located at [address] [place], [place], [place] [address] No appointment is necessary.   Labs are drawn by [facility]. Please bring your co-pay at the time of your lab draw.  You may receive a bill from [facility] for your lab work.  If you wish to have your labs drawn at another location, please call the office 24 hours in advance to send orders.  If you have any questions regarding directions or hours of operation,  please call [phone].   As a reminder, please drink plenty of water prior to coming for your lab work. Thanks!   Vaccines You are taking a medication(s) that can suppress your immune system.  The following immunizations are recommended: Flu annually Covid-19  Td/Tdap (tetanus, diphtheria, pertussis) every 10 years Pneumonia (Prevnar 15 then Pneumovax 23 at least 1 year apart.  Alternatively, can take Prevnar 20 without needing additional dose) Shingrix (after age 55): 2  doses from 4 weeks to 6 months apart  Please check with your PCP to make sure you are up to date.   If you test POSITIVE for COVID19 and have MILD to MODERATE symptoms: First, call your PCP if you would like to receive COVID19 treatment AND Hold your medications during the infection and for at least 1 week after your symptoms have resolved: Injectable medication (Benlysta, Cimzia, Cosentyx, Enbrel, Humira, Orencia, Remicade, Simponi, Stelara, Taltz, Tremfya) Methotrexate Leflunomide (Arava) Azathioprine Mycophenolate (Cellcept) Roma Kayser, or Rinvoq Otezla If you take Actemra or Kevzara, you DO NOT need to hold these for COVID19 infection.  If you test POSITIVE for COVID19 and have NO symptoms: First, call your PCP if you would like to receive COVID19 treatment AND Hold your medications for at least 10 days after the day that you tested positive Injectable medication (Benlysta, Cimzia, Cosentyx, Enbrel, Humira, Orencia, Remicade, Simponi, Stelara, Taltz, Tremfya) Methotrexate Leflunomide (Arava) Azathioprine Mycophenolate (Cellcept) Roma Kayser, or Rinvoq Otezla If you take Actemra or Kevzara, you DO NOT need to hold these for COVID19 infection.  If you have signs or symptoms of an infection or start antibiotics: First, call your PCP for workup of your infection. Hold your medication through the infection, until you complete your antibiotics, and until symptoms resolve if you take the following: Injectable medication (Actemra, Benlysta, Cimzia, Cosentyx, Enbrel, Humira, Kevzara, Orencia, Remicade, Simponi, Stelara, Taltz, Tremfya) Methotrexate Leflunomide (Arava) Mycophenolate (Cellcept) Roma Kayser, or Rinvoq  Heart Disease Prevention   Your inflammatory disease increases your risk of  heart disease which includes heart attack, stroke, atrial fibrillation (irregular heartbeats), high blood pressure, heart failure and atherosclerosis (plaque in the arteries).   It is important to reduce your risk by:   Keep blood pressure, cholesterol, and blood sugar at healthy levels   Smoking Cessation   Maintain a healthy weight  BMI 20-25   Eat a healthy diet  Plenty of fresh fruit, vegetables, and whole grains  Limit saturated fats, foods high in sodium, and added sugars  DASH and Mediterranean diet   Increase physical activity  Recommend moderate physically activity for 150 minutes per week/ 30 minutes a day for five days a week These can be broken up into three separate ten-minute sessions during the day.   Reduce Stress  Meditation, slow breathing exercises, yoga, coloring books  Dental visits twice a year   Steps to Quit Smoking Smoking tobacco is the leading cause of preventable death. It can affect almost every organ in the body. Smoking puts you and those around you at risk for developing many serious chronic diseases. Quitting smoking can be difficult, but it is one of the best things that you can do for your health. It is nevertoo late to quit. How do I get ready to quit? When you decide to quit smoking, create a plan to help you succeed. Before you quit: Pick a date to quit. Set a date within the next 2 weeks to give you time to prepare. Write down the reasons why you are quitting. Keep this list in places where you will see it often. Tell your family, friends, and co-workers that you are quitting. Support from your loved ones can make quitting easier. Talk with your health care provider about your options for quitting smoking. Find out what treatment options are covered by your health insurance. Identify people, places, things, and activities that make you want to smoke (triggers). Avoid them. What first steps can I take to quit smoking? Throw away all cigarettes at home, at work, and in your car. Throw away smoking accessories, such as Set designer. Clean your car. Make sure to empty the ashtray. Clean your home, including  curtains and carpets. What strategies can I use to quit smoking? Talk with your health care provider about combining strategies, such as taking medicines while you are also receiving in-person counseling. Using these two strategies together makes you more likely to succeed in quitting than if you used either strategy on its own. If you are pregnant or breastfeeding, talk with your health care provider about finding counseling or other support strategies to quit smoking. Do not take medicine to help you quit smoking unless your health care provider tells you to do so. To quit smoking: Quit right away Quit smoking completely, instead of gradually reducing how much you smoke over a period of time. Research shows that stopping smoking right away is more successful than gradually quitting. Attend in-person counseling to help you build problem-solving skills. You are more likely to succeed in quitting if you attend counseling sessions regularly. Even short sessions of 10 minutes can be effective. Take medicine You may take medicines to help you quit smoking. Some medicines require a prescription and some you can purchase over-the-counter. Medicines may have nicotine in them to replace the nicotine in cigarettes. Medicines may: Help to stop cravings. Help to relieve withdrawal symptoms. Your health care provider may recommend: Nicotine patches, gum, or lozenges. Nicotine inhalers or sprays. Non-nicotine medicine that is taken by mouth. Find resources  Find resources and support systems that can help you to quit smoking and remain smoke-free after you quit. These resources are most helpful when you use them often. They include: Online chats with a Veterinary surgeon. Telephone quitlines. Printed Materials engineer. Support groups or group counseling. Text messaging programs. Mobile phone apps or applications. Use apps that can help you stick to your quit plan by providing reminders, tips, and encouragement.  There are many free apps for mobile devices as well as websites. Examples include Quit Guide from the [employer] and smokefree.gov What things can I do to make it easier to quit?  Reach out to your family and friends for support and encouragement. Call telephone quitlines (1-800-QUIT-NOW), reach out to support groups, or work with a counselor for support. Ask people who smoke to avoid smoking around you. Avoid places that trigger you to smoke, such as bars, parties, or smoke-break areas at work. Spend time with people who do not smoke. Lessen the stress in your life. Stress can be a smoking trigger for some people. To lessen stress, try: Exercising regularly. Doing deep-breathing exercises. Doing yoga. Meditating. Performing a body scan. This involves closing your eyes, scanning your body from head to toe, and noticing which parts of your body are particularly tense. Try to relax the muscles in those areas. How will I feel when I quit smoking? Day 1 to 3 weeks Within the first 24 hours of quitting smoking, you may start to feel withdrawal symptoms. These symptoms are usually most noticeable 2-3 days after quitting, but they usually do not last for more than 2-3 weeks. You may experience these symptoms: Mood swings. Restlessness, anxiety, or irritability. Trouble concentrating. Dizziness. Strong cravings for sugary foods and nicotine. Mild weight gain. Constipation. Nausea. Coughing or a sore throat. Changes in how the medicines that you take for unrelated issues work in your body. Depression. Trouble sleeping (insomnia). Week 3 and afterward After the first 2-3 weeks of quitting, you may start to notice more positive results, such as: Improved sense of smell and taste. Decreased coughing and sore throat. Slower heart rate. Lower blood pressure. Clearer skin. The ability to breathe more easily. Fewer sick days. Quitting smoking can be very challenging. Do not get discouraged if you are  not successful the first time. Some people need to make many attempts to quit before they achieve long-term success. Do your best to stick to your quit plan, and talk with your health care provider if you haveany questions or concerns. Summary Smoking tobacco is the leading cause of preventable death. Quitting smoking is one of the best things that you can do for your health. When you decide to quit smoking, create a plan to help you succeed. Quit smoking right away, not slowly over a period of time. When you start quitting, seek help from your health care provider, family, or friends. This information is not intended to replace advice given to you by your health care provider. Make sure you discuss any questions you have with your healthcare provider. Document Revised: 10/17/2018 Document Reviewed: 04/12/2018 Elsevier Patient Education  2022 [employer].

## 2020-09-21 NOTE — Progress Notes (Signed)
Hemoglobin is mildly elevated and stable.  CMP is normal.

## 2020-09-26 ENCOUNTER — Other Ambulatory Visit: Payer: Self-pay | Admitting: *Deleted

## 2020-09-26 MED ORDER — FOLIC ACID 1 MG PO TABS: 2.0000 mg | ORAL_TABLET | Freq: Every day | ORAL | 3 refills | Status: DC

## 2020-09-26 MED ORDER — METHOTREXATE 2.5 MG PO TABS: ORAL_TABLET | ORAL | 0 refills | Status: DC

## 2020-09-26 NOTE — Telephone Encounter (Signed)
Refill request received via fax  Next Visit: 12/22/2020  Last Visit: 09/20/2020  DX: Psoriatic arthritis   Current Dose per office note 09/20/2020: Methotrexate 10 tablets p.o. weekly, folic acid 2 mg p.o. daily  Labs: 09/20/2020 Hemoglobin is mildly elevated and stable.  CMP is normal.  Okay to refill MTX and Folic Acid?

## 2020-12-08 ENCOUNTER — Other Ambulatory Visit (HOSPITAL_COMMUNITY): Payer: Self-pay

## 2020-12-08 ENCOUNTER — Telehealth: Payer: Self-pay | Admitting: Pharmacist

## 2020-12-08 NOTE — Progress Notes (Signed)
Office Visit Note  Patient: Sherry Stein             Date of Birth: [birth year]           MRN: [id number]             PCP: Juanda Chance Referring: Mindi Curling, PA-C Visit Date: 12/22/2020 Occupation: @GUAROCC @  Subjective:  Pain in multiple joints  History of Present Illness: Selina Palu is a 50 y.o. female with history of psoriatic arthritis.  She is on cosentyx 300 mg sq injections every 28 days, methotrexate 10 tablets by mouth once weekly, and folic acid 2 mg daily.  She is tolerating both medications without any side effects or injection site reactions.  She was initially started on Cosentyx in June 2022.  She continues to experience intermittent flares especially in both feet.  She has tenderness and swelling of the left fourth toe which started about 1 month ago.  She has pain in the bottom of both feet due to underlying plantar fasciitis.  She has difficulty finding shoes that are comfortable to wear.  She states that she recently went on vacation and had to walk for long distances while in the airport which exacerbated her feet pain.  She states she continues to experience intermittent flares in both hands.  She states that her psoriasis has cleared while on Cosentyx.   Activities of Daily Living:  Patient reports morning stiffness for 2 hours.   Patient Reports nocturnal pain.  Difficulty dressing/grooming: Denies Difficulty climbing stairs: Denies Difficulty getting out of chair: Denies Difficulty using hands for taps, buttons, cutlery, and/or writing: Reports  Review of Systems  Constitutional:  Positive for fatigue.  HENT:  Negative for mouth dryness.   Eyes:  Negative for dryness.  Respiratory:  Negative for shortness of breath.   Cardiovascular:  Negative for swelling in legs/feet.  Gastrointestinal:  Negative for constipation.  Endocrine: Positive for heat intolerance.  Genitourinary:  Negative for difficulty urinating.   Musculoskeletal:  Positive for joint pain, joint pain, joint swelling, morning stiffness and muscle tenderness.  Skin:  Negative for rash.  Allergic/Immunologic: Positive for susceptible to infections.  Neurological:  Negative for numbness.  Hematological:  Negative for bruising/bleeding tendency.  Psychiatric/Behavioral:  Negative for sleep disturbance.    PMFS History:  Patient Active Problem List   Diagnosis Date Noted   Hyperlipidemia 08/10/2016   GAD (generalized anxiety disorder) 08/04/2016   History of autoimmune disorder 03/20/2016   Open leg wound, right, sequela 03/20/2016   Drug therapy 05/10/2015   Abnormal glucose 05/09/2015   Acute frontal sinusitis 05/09/2015   Allergic conjunctivitis 05/09/2015   Amenorrhea 05/09/2015   Allergic rhinitis 05/09/2015   Benign essential hypertension 05/09/2015   Depression 05/09/2015   Foot arch pain 05/09/2015   Edema 05/09/2015   Hiatal hernia with GERD without esophagitis 05/09/2015   Muscle spasm 05/09/2015   Pain in joint involving multiple sites 05/09/2015   Psoriasis 05/09/2015   Menorrhagia with irregular cycle 04/10/2015   Hammer toe of right foot 04/07/2015   Pain from implanted hardware 04/07/2015    Past Medical History:  Diagnosis Date   Arthritis    Diabetes mellitus without complication ([place])    Hypertension     Family History  Problem Relation Age of Onset   Hypertension Mother    Diabetes Father    COPD Father    Hypertension Father    Cancer Sister    High Cholesterol Brother  Crohn's disease Son    Past Surgical History:  Procedure Laterality Date   BUNIONECTOMY Bilateral 2007   CARPAL TUNNEL RELEASE Right 07/2019   REPLACEMENT TOTAL KNEE Left 03/2019   REPLACEMENT TOTAL KNEE Right 12/2018   TOE SURGERY Right    great toe x3   TONSILLECTOMY AND ADENOIDECTOMY     TYMPANOSTOMY TUBE PLACEMENT     WISDOM TOOTH EXTRACTION     20s   Social History   Social History Narrative   ** Merged  History Encounter **       Immunization History  Administered Date(s) Administered   PFIZER(Purple Top)SARS-COV-2 Vaccination 11/24/2019, 12/15/2019     Objective: Vital Signs: BP 112/70 (BP Location: Left Arm, Patient Position: Sitting, Cuff Size: Normal)   Pulse 81   Resp 16   Ht 5\' 4"  (1.626 m)   Wt 219 lb (99.3 kg)   BMI 37.59 kg/m    Physical Exam Vitals and nursing note reviewed.  Constitutional:      Appearance: She is well-developed.  HENT:     Head: Normocephalic and atraumatic.  Eyes:     Conjunctiva/sclera: Conjunctivae normal.  Pulmonary:     Effort: Pulmonary effort is normal.  Abdominal:     Palpations: Abdomen is soft.  Musculoskeletal:     Cervical back: Normal range of motion.  Skin:    General: Skin is warm and dry.     Capillary Refill: Capillary refill takes less than 2 seconds.  Neurological:     Mental Status: She is alert and oriented to person, place, and time.  Psychiatric:        Behavior: Behavior normal.     Musculoskeletal Exam: C-spine has good range of motion with no discomfort.  Tenderness over the right SI joint noted.  No midline spinal tenderness noted.  Shoulder joints, elbow joints, wrist joints have good range of motion with no discomfort.  Bilateral PIP and DIP thickening as well as right second and third MCP joint thickening noted.  Hip joints have good range of motion with no groin pain.  Bilateral knee replacements have good range of motion with an effusion in the right knee.  Synovial thickening of both ankle joints noted.  Hammertoes and thickening noted in both feet.  Planter fasciitis both feet noted.  No evidence of achilles tendonitis.  Synovitis of the PIP and DIP of the left fourth toe.  CDAI Exam: CDAI Score: -- Patient Global: --; Provider Global: -- Swollen: 3 ; Tender: 3  Joint Exam 12/22/2020      Right  Left  MTP 4     Swollen Tender  PIP 4 (toe)     Swollen Tender  DIP 4 (toe)     Swollen Tender      Investigation: No additional findings.  Imaging: No results found.  Recent Labs: Lab Results  Component Value Date   WBC 10.6 09/20/2020   HGB 15.6 (H) 09/20/2020   PLT 297 09/20/2020   NA 140 09/20/2020   K 4.5 09/20/2020   CL 101 09/20/2020   CO2 31 09/20/2020   GLUCOSE 95 09/20/2020   BUN 20 09/20/2020   CREATININE 0.66 09/20/2020   BILITOT 0.5 09/20/2020   AST 14 09/20/2020   ALT 15 09/20/2020   PROT 7.0 09/20/2020   CALCIUM 9.5 09/20/2020   GFRAA 105 06/13/2020   QFTBGOLDPLUS NEGATIVE 05/02/2020    Speciality Comments: MTX since2004, Humira-2004-2017-quit working, cosyntex 2018x 8 months, restarted May 11, 2020   FU every  3 months  Procedures:  No procedures performed Allergies: Patient has no known allergies.    Assessment / Plan:     Visit Diagnoses: Psoriatic arthritis ([place]) - Dxd in 2004 by Dr. Rockwell [place].  She has been on methotrexate since 2004.  Humira 2004 01/2016, Cosentyx 2018 for 8 months.  She restarted Cosentyx on July 06, 2020: She presents today with tenderness and synovitis of the PIP and DIP joint of the left fourth toe.  She has been experiencing intermittent pain and inflammation in both hands and both feet over the past 1 month.  Her feet pain was exacerbated by walking long distances while in the airport several weeks ago.  She has ongoing plantar fasciitis in both feet and has difficulty finding proper fitting shoes which are comfortable while walking for long distances.  She remains on Cosentyx 300 mg sq injections every 28 days, methotrexate 10 tablets by mouth once weekly, and folic acid 2 mg by mouth daily.  She has not missed any doses of Cosentyx or methotrexate recently.  She is tolerating both medications without any side effects.  Overall she has noticed improvement on Cosentyx especially with psoriasis skin clearance since restarting in June 2022.  She would like to continue to give Cosentyx more time to see the true efficacy.   Different treatment options were discussed today.  She is open to switching from oral to injectable methotrexate to see if it will increase the efficacy.  We will apply for Rasuvo 25 mg subcutaneous injections once weekly.  She will remain on Cosentyx as prescribed.  She will follow-up in the office in 2 to 3 months to assess her response.  A prednisone taper starting 20 mg tapering by 5 mg every 4 days was sent to the pharmacy.  Psoriasis -Previously in the inguinal region.  Cleared since restarting on Cosentyx in June 2022.  High risk medication use - Cosentyx 300 mg sq injections once every 28 days.  She will be switching from oral to injectable MTX.  Applying for rasuvo 25 mg sq injections once weekly.   CBC updated on 11/04/20.  CMP updated on 09/20/20. She is due to update lab work today.  Orders released.  Her next lab work will be due in February and every 3 months. TB gold negative on 05/02/20.  - Plan: COMPLETE METABOLIC PANEL WITH GFR, CBC with Differential/Platelet She was diagnosed with covid-19 on 11/04/20 and was prescribed paxlovid which improved her symptoms.  She was unable to hold cosentyx due to injecting several days prior to testing positive but she was able to hold MTX until the infection completely cleared.  Discussed the importance of holding cosentyx and MTX if she develops signs or symptoms of an infection and to resume once the infection has completely cleared.   Primary osteoarthritis of both hands: X-rays of both hands obtained on 05/02/2020 were consistent with inflammatory arthritis and osteoarthritis overlap.  She has PIP and DIP thickening of both hands.  Discussed the importance of joint protection and muscle strengthening.  Bilateral sacroiliitis ([facility]) - Bilateral SI joint sclerosis was noted on the x-rays.  She had a left SI joint cortisone injection performed on 06/13/2020 provided significant relief.  Her right SI joint pain has been tolerable recently.  She has tenderness  over the right SI joint on examination today.  She experiences some morning stiffness and discomfort when rising from bed but typically her pain improves with physical activity.  A prednisone taper starting at 20 mg  tapering by 5 mg every sent to the pharmacy.  Status post total knee replacement, bilateral - right total knee replacement November 2020 and left total knee replacement February 2021 by Dr. Christell Constant at [place].  Doing well.  She has good range of motion of both knee joints with an effusion in the right knee.  Pain in both feet - X-rays were consistent with psoriatic arthritis.  She has severe psoriatic arthritis in her feet and chronic discomfort.  She has difficulty finding comfortable shoes due to the pain from her hammertoes as well as plantar fasciitis in both feet.  Discussed the importance of wearing proper fitting shoes.  She has synovitis of the PIP and DIP of the left fourth toe on examination today.  Her feet pain was exacerbated by walking prolonged distances while at the airport several weeks ago.  A prednisone taper starting at 20 mg tapering by 5 mg every 4 days was sent to the pharmacy.  Other medical conditions are listed as follows:   Benign essential hypertension: BP is 112/70 today in the office.   Prediabetes  Hiatal hernia with GERD without esophagitis  Mixed hyperlipidemia  GAD (generalized anxiety disorder)  Family history of Crohn's disease -  son  Encounter for screening colonoscopy - Patient was referred to gastroenterology for colonoscopy.  Patient did not respond to the GI   Smoker  Orders: Orders Placed This Encounter  Procedures   COMPLETE METABOLIC PANEL WITH GFR   CBC with Differential/Platelet   Meds ordered this encounter  Medications   predniSONE (DELTASONE) 5 MG tablet    Sig: Take 4 tablets by mouth daily x4 days, 3 tablets daily x4 days, 2 tablets daily x4 days, 1 tablet daily x4 days.    Dispense:  40 tablet    Refill:  0      Follow-Up Instructions: Return in about 3 months (around 03/24/2021) for Psoriatic arthritis.   Gearldine Bienenstock, PA-C  Note - This record has been created using Dragon software.  Chart creation errors have been sought, but may not always  have been located. Such creation errors do not reflect on  the standard of medical care.

## 2020-12-08 NOTE — Telephone Encounter (Signed)
Received Cosentyx PAP re-enrollment application from [employer]   Will place provider portion in Dr. Fatima Sanger folder to be signed (along with med list, insurance card copy).   Mailed patient her portion today to be completed and returned to pharmacy team with letter noting that income docs are now required which is change from prior years.  Will need FAXED prescription sent with renewal application as well. Patient does not have Part D insurance or any prescription insurance (only Medicare Part A/B)     Chesley Mires, PharmD, MPH, BCPS Clinical Pharmacist (Rheumatology and Pulmonology)

## 2020-12-12 NOTE — Telephone Encounter (Signed)
Received signed provider portion of [employer] Cosentyx PAP application. Will place in "PAP pending info" folder in pharmacy office while we wait for patient's portion  Annaliyah Willig, PharmD, MPH, BCPS Clinical Pharmacist (Rheumatology and Pulmonology) 

## 2020-12-21 NOTE — Telephone Encounter (Signed)
Left VM for patient and advised to bring Cosentyx [employer] PAP enrollment application to [facility] with Sherron Ales, PA-C, tomorrow 12/22/20 along with income documents that are now required  Chesley Mires, PharmD, MPH, BCPS Clinical Pharmacist (Rheumatology and Pulmonology)

## 2020-12-22 ENCOUNTER — Other Ambulatory Visit: Payer: Self-pay

## 2020-12-22 ENCOUNTER — Telehealth: Payer: Self-pay | Admitting: *Deleted

## 2020-12-22 ENCOUNTER — Ambulatory Visit (INDEPENDENT_AMBULATORY_CARE_PROVIDER_SITE_OTHER): Payer: Medicare Other | Admitting: Physician Assistant

## 2020-12-22 ENCOUNTER — Encounter: Payer: Self-pay | Admitting: Physician Assistant

## 2020-12-22 VITALS — BP 112/70 | HR 81 | Resp 16 | Ht 64.0 in | Wt 219.0 lb

## 2020-12-22 DIAGNOSIS — Z8379 Family history of other diseases of the digestive system: Secondary | ICD-10-CM

## 2020-12-22 DIAGNOSIS — F411 Generalized anxiety disorder: Secondary | ICD-10-CM

## 2020-12-22 DIAGNOSIS — L4050 Arthropathic psoriasis, unspecified: Secondary | ICD-10-CM

## 2020-12-22 DIAGNOSIS — M79641 Pain in right hand: Secondary | ICD-10-CM

## 2020-12-22 DIAGNOSIS — M19042 Primary osteoarthritis, left hand: Secondary | ICD-10-CM

## 2020-12-22 DIAGNOSIS — L409 Psoriasis, unspecified: Secondary | ICD-10-CM

## 2020-12-22 DIAGNOSIS — Z79899 Other long term (current) drug therapy: Secondary | ICD-10-CM

## 2020-12-22 DIAGNOSIS — E782 Mixed hyperlipidemia: Secondary | ICD-10-CM

## 2020-12-22 DIAGNOSIS — M79671 Pain in right foot: Secondary | ICD-10-CM

## 2020-12-22 DIAGNOSIS — F17200 Nicotine dependence, unspecified, uncomplicated: Secondary | ICD-10-CM

## 2020-12-22 DIAGNOSIS — K449 Diaphragmatic hernia without obstruction or gangrene: Secondary | ICD-10-CM

## 2020-12-22 DIAGNOSIS — Z96653 Presence of artificial knee joint, bilateral: Secondary | ICD-10-CM

## 2020-12-22 DIAGNOSIS — M461 Sacroiliitis, not elsewhere classified: Secondary | ICD-10-CM

## 2020-12-22 DIAGNOSIS — Z1211 Encounter for screening for malignant neoplasm of colon: Secondary | ICD-10-CM

## 2020-12-22 DIAGNOSIS — K219 Gastro-esophageal reflux disease without esophagitis: Secondary | ICD-10-CM

## 2020-12-22 DIAGNOSIS — I10 Essential (primary) hypertension: Secondary | ICD-10-CM

## 2020-12-22 DIAGNOSIS — M79672 Pain in left foot: Secondary | ICD-10-CM

## 2020-12-22 DIAGNOSIS — R7303 Prediabetes: Secondary | ICD-10-CM

## 2020-12-22 DIAGNOSIS — M19041 Primary osteoarthritis, right hand: Secondary | ICD-10-CM | POA: Diagnosis not present

## 2020-12-22 MED ORDER — PREDNISONE 5 MG PO TABS: ORAL_TABLET | ORAL | 0 refills | Status: DC

## 2020-12-22 NOTE — Telephone Encounter (Addendum)
Patient will Rasuvo PAP through [employer]. ATC to patient to review that I will be mailing to her home and will require income documents. Will mail to patient today and have provider portion completed on Monday, 12/26/20  Please start Rasuvo BIV through insurance to see if medication is affordable  Dose: 25mg  every 7 days  Dx: Psoriatic arthritis (L40.5) and Psoriasis (L40.9)  Previously tried therapies: Oral methotrexate in combination with Cosentyx  , PharmD, MPH, BCPS Clinical Pharmacist (Rheumatology and Pulmonology)

## 2020-12-22 NOTE — Telephone Encounter (Signed)
Per Sherron Ales PA-C, please apply for Rasuvo.

## 2020-12-22 NOTE — Patient Instructions (Signed)
Standing Labs We placed an order today for your standing lab work.   Please have your standing labs drawn in February and every 3 months   If possible, please have your labs drawn 2 weeks prior to your appointment so that the provider can discuss your results at your appointment.  Please note that you may see your imaging and lab results in MyChart before we have reviewed them. We may be awaiting multiple results to interpret others before contacting you. Please allow our office up to 72 hours to thoroughly review all of the results before contacting the office for clarification of your results.  We have open lab daily: Monday through Thursday from 1:30-4:30 PM and Friday from 1:30-4:00 PM at the office of Dr. Shaili Deveshwar, [facility].   Please be advised, all patients with office appointments requiring lab work will take precedent over walk-in lab work.  If possible, please come for your lab work on Monday and Friday afternoons, as you may experience shorter wait times. The office is located at [address], [address], [place], [place] [address] No appointment is necessary.   Labs are drawn by [facility]. Please bring your co-pay at the time of your lab draw.  You may receive a bill from [facility] for your lab work.  If you wish to have your labs drawn at another location, please call the office 24 hours in advance to send orders.  If you have any questions regarding directions or hours of operation,  please call [phone].   As a reminder, please drink plenty of water prior to coming for your lab work. Thanks!  

## 2020-12-23 LAB — COMPLETE METABOLIC PANEL WITH GFR
AG Ratio: 1.7 (calc) (ref 1.0–2.5)
ALT: 18 U/L (ref 6–29)
AST: 15 U/L (ref 10–35)
Albumin: 4.7 g/dL (ref 3.6–5.1)
Alkaline phosphatase (APISO): 77 U/L (ref 37–153)
BUN: 18 mg/dL (ref 7–25)
CO2: 28 mmol/L (ref 20–32)
Calcium: 9.7 mg/dL (ref 8.6–10.4)
Chloride: 103 mmol/L (ref 98–110)
Creat: 0.76 mg/dL (ref 0.50–1.03)
Globulin: 2.7 g/dL (calc) (ref 1.9–3.7)
Glucose, Bld: 83 mg/dL (ref 65–99)
Potassium: 4.2 mmol/L (ref 3.5–5.3)
Sodium: 141 mmol/L (ref 135–146)
Total Bilirubin: 0.3 mg/dL (ref 0.2–1.2)
Total Protein: 7.4 g/dL (ref 6.1–8.1)
eGFR: 94 mL/min/{1.73_m2} (ref >=60)

## 2020-12-23 LAB — CBC WITH DIFFERENTIAL/PLATELET
Absolute Monocytes: 814 cells/uL (ref 200–950)
Basophils Absolute: 55 cells/uL (ref 0–200)
Basophils Relative: 0.5 %
Eosinophils Absolute: 429 cells/uL (ref 15–500)
Eosinophils Relative: 3.9 %
HCT: 45.9 % — ABNORMAL HIGH (ref 35.0–45.0)
Hemoglobin: 15.9 g/dL — ABNORMAL HIGH (ref 11.7–15.5)
Lymphs Abs: 2893 cells/uL (ref 850–3900)
MCH: 34 pg — ABNORMAL HIGH (ref 27.0–33.0)
MCHC: 34.6 g/dL (ref 32.0–36.0)
MCV: 98.3 fL (ref 80.0–100.0)
MPV: 9.9 fL (ref 7.5–12.5)
Monocytes Relative: 7.4 %
Neutro Abs: 6809 cells/uL (ref 1500–7800)
Neutrophils Relative %: 61.9 %
Platelets: 322 10*3/uL (ref 140–400)
RBC: 4.67 10*6/uL (ref 3.80–5.10)
RDW: 14.4 % (ref 11.0–15.0)
Total Lymphocyte: 26.3 %
WBC: 11 10*3/uL — ABNORMAL HIGH (ref 3.8–10.8)

## 2020-12-23 NOTE — Progress Notes (Signed)
White cell count is mildly elevated.  Hemoglobin is elevated yesterday.  CMP is normal.

## 2020-12-26 NOTE — Telephone Encounter (Signed)
Signed provider form, insurance card copy, and med list placed in "PAP pending info" folder in pharmacy office. Patient portion mailed to her on 12/22/20.  ATC #2 patient to review income documents requirement but unable to reach again.  Chesley Mires, PharmD, MPH, BCPS Clinical Pharmacist (Rheumatology and Pulmonology)

## 2020-12-26 NOTE — Telephone Encounter (Addendum)
Patient had OV on 12/22/20 and took home [employer] PAP renewal application for Cosentyx. Will complete and return to clinic  ATC #2 patient to review income documents but unable to reach again.  Chesley Mires, PharmD, MPH, BCPS Clinical Pharmacist (Rheumatology and Pulmonology)

## 2021-01-05 NOTE — Telephone Encounter (Signed)
Called patient for update on Cosentyx and Rasuvo applications.  She states she is unsure if she has 2021 tax return since she only draws income from disability. Advised that she should be able to submit proof of this income for the past 3 months for these applications. She verbalized understanding.  She states she will plan to complete and bring them both to clinic on Monday, 01/09/21.  Chesley Mires, PharmD, MPH, BCPS Clinical Pharmacist (Rheumatology and Pulmonology)

## 2021-01-12 NOTE — Telephone Encounter (Signed)
Called patient to f/u on Rasuvo and Cosentyx applications since she stated that she would be dropping off both applications on 01/09/21. Unable to reach - left VM requesting return call to discuss  Chesley Mires, PharmD, MPH, BCPS Clinical Pharmacist (Rheumatology and Pulmonology)

## 2021-01-19 NOTE — Telephone Encounter (Addendum)
Called patient to f/u on Rasuvo and Cosentyx applications since she stated that she would be dropping off both applications on 01/09/21. Unable to reach - left VM requesting return call to discuss.   Will ATC one more time before closing encounter  Chesley Mires, PharmD, MPH, BCPS Clinical Pharmacist (Rheumatology and Pulmonology)

## 2021-01-19 NOTE — Telephone Encounter (Signed)
Called patient again to f/u on Rasuvo and Cosentyx applications since she stated that she would be dropping off both applications on 01/09/21. Unable to reach - left VM requesting return call to discuss.   Chesley Mires, PharmD, MPH, BCPS Clinical Pharmacist (Rheumatology and Pulmonology)

## 2021-01-24 ENCOUNTER — Other Ambulatory Visit: Payer: Self-pay | Admitting: *Deleted

## 2021-01-24 DIAGNOSIS — L4050 Arthropathic psoriasis, unspecified: Secondary | ICD-10-CM

## 2021-01-24 DIAGNOSIS — L409 Psoriasis, unspecified: Secondary | ICD-10-CM

## 2021-01-24 NOTE — Telephone Encounter (Signed)
Next Visit: 03/23/2021  Last Visit: 12/22/2020  Last Fill: 08/29/2020  VG:[id number] arthritis   Current Dose per office note 12/22/2020: Cosentyx 300 mg sq injections once every 28 days.   Labs: 12/22/2020  TB Gold: 05/02/2020   Okay to refill Cosentyx?

## 2021-01-25 MED ORDER — COSENTYX SENSOREADY (300 MG) 150 MG/ML ~~LOC~~ SOAJ: 300.0000 mg | SUBCUTANEOUS | 0 refills | Status: DC

## 2021-02-13 NOTE — Telephone Encounter (Signed)
Called patient to f/u on Rasuvo and Cosentyx applications since she stated that she would be dropping off both applications on 01/09/21. Unable to reach - left VM requesting return call to discuss.   Will ATC one more time before closing encounter  Chesley Mires, PharmD, MPH, BCPS Clinical Pharmacist (Rheumatology and Pulmonology)

## 2021-02-13 NOTE — Telephone Encounter (Signed)
ATC to determine status of Rasuvo and Cosentyx PAP applications for [id number]. Unable to reach. Advised in VM that her enrollment in both programs expired on 02/04/21 and she will no longer be able to receive refills for this calendar year.   Requested return call for update  Knox Saliva, PharmD, MPH, BCPS Clinical Pharmacist (Rheumatology and Pulmonology)

## 2021-02-15 NOTE — Telephone Encounter (Signed)
Called patient on 02/13/21 to f/u on Rasuvo and Cosentyx applications since she stated that she would be dropping off both applications on 01/09/21. Unable to reach - left VM requesting return call to discuss.    Will ATC one more time before closing encounter   Chesley Mires, PharmD, MPH, BCPS Clinical Pharmacist (Rheumatology and Pulmonology)

## 2021-02-22 ENCOUNTER — Other Ambulatory Visit: Payer: Self-pay | Admitting: Physician Assistant

## 2021-02-22 NOTE — Telephone Encounter (Signed)
Called patient regarding Rasuvo and Cosentyx applications. She states she has no service where she lives and did not receive any voicemails that I left. She states she will plan to drop off applications tomorrow. I advised her to bring income documents for proof that have to be submitted with both applications. She verbalized understanding. ° °She states she is out of medication at this time and knows she needs to stay on top of it. ° °Lynford Espinoza, PharmD, MPH, BCPS °Clinical Pharmacist (Rheumatology and Pulmonology) °

## 2021-02-22 NOTE — Telephone Encounter (Signed)
Called patient regarding Rasuvo and Cosentyx applications. She states she has no service where she lives and did not receive any voicemails that I left. She states she will plan to drop off applications tomorrow. I advised her to bring income documents for proof that have to be submitted with both applications. She verbalized understanding.  She states she is out of medication at this time and knows she needs to stay on top of it.  Chesley Mires, PharmD, MPH, BCPS Clinical Pharmacist (Rheumatology and Pulmonology)

## 2021-03-10 NOTE — Progress Notes (Unsigned)
Office Visit Note  Patient: Sherry Stein             Date of Birth: August 16, 1970           MRN: [id number]             PCP: Juanda Chance Referring: Juanda Chance Visit Date: 03/23/2021 Occupation: @GUAROCC @  Subjective:  No chief complaint on file.   History of Present Illness: Sherry Stein is a 51 y.o. female ***   Activities of Daily Living:  Patient reports morning stiffness for *** {minute/hour:19697}.   Patient {ACTIONS;DENIES/REPORTS:21021675::"Denies"} nocturnal pain.  Difficulty dressing/grooming: {ACTIONS;DENIES/REPORTS:21021675::"Denies"} Difficulty climbing stairs: {ACTIONS;DENIES/REPORTS:21021675::"Denies"} Difficulty getting out of chair: {ACTIONS;DENIES/REPORTS:21021675::"Denies"} Difficulty using hands for taps, buttons, cutlery, and/or writing: {ACTIONS;DENIES/REPORTS:21021675::"Denies"}  No Rheumatology ROS completed.   PMFS History:  Patient Active Problem List   Diagnosis Date Noted   Hyperlipidemia 08/10/2016   GAD (generalized anxiety disorder) 08/04/2016   History of autoimmune disorder 03/20/2016   Open leg wound, right, sequela 03/20/2016   Drug therapy 05/10/2015   Abnormal glucose 05/09/2015   Acute frontal sinusitis 05/09/2015   Allergic conjunctivitis 05/09/2015   Amenorrhea 05/09/2015   Allergic rhinitis 05/09/2015   Benign essential hypertension 05/09/2015   Depression 05/09/2015   Foot arch pain 05/09/2015   Edema 05/09/2015   Hiatal hernia with GERD without esophagitis 05/09/2015   Muscle spasm 05/09/2015   Pain in joint involving multiple sites 05/09/2015   Psoriasis 05/09/2015   Menorrhagia with irregular cycle 04/10/2015   Hammer toe of right foot 04/07/2015   Pain from implanted hardware 04/07/2015    Past Medical History:  Diagnosis Date   Arthritis    Diabetes mellitus without complication ([place])    Hypertension     Family History  Problem Relation Age of Onset   Hypertension Mother     Diabetes Father    COPD Father    Hypertension Father    Cancer Sister    High Cholesterol Brother    Crohn's disease Son    Past Surgical History:  Procedure Laterality Date   BUNIONECTOMY Bilateral 2007   CARPAL TUNNEL RELEASE Right 07/2019   REPLACEMENT TOTAL KNEE Left 03/2019   REPLACEMENT TOTAL KNEE Right 12/2018   TOE SURGERY Right    great toe x3   TONSILLECTOMY AND ADENOIDECTOMY     TYMPANOSTOMY TUBE PLACEMENT     WISDOM TOOTH EXTRACTION     20s   Social History   Social History Narrative   ** Merged History Encounter **       Immunization History  Administered Date(s) Administered   PFIZER(Purple Top)SARS-COV-2 Vaccination 11/24/2019, 12/15/2019   Pfizer Covid-19 Vaccine Bivalent Booster 74yrs & up 03/01/2021     Objective: Vital Signs: There were no vitals taken for this visit.   Physical Exam   Musculoskeletal Exam: ***  CDAI Exam: CDAI Score: -- Patient Global: --; Provider Global: -- Swollen: --; Tender: -- Joint Exam 03/23/2021   No joint exam has been documented for this visit   There is currently no information documented on the homunculus. Go to the Rheumatology activity and complete the homunculus joint exam.  Investigation: No additional findings.  Imaging: No results found.  Recent Labs: Lab Results  Component Value Date   WBC 11.0 (H) 12/22/2020   HGB 15.9 (H) 12/22/2020   PLT 322 12/22/2020   NA 141 12/22/2020   K 4.2 12/22/2020   CL 103 12/22/2020   CO2 28 12/22/2020   GLUCOSE  83 12/22/2020   BUN 18 12/22/2020   CREATININE 0.76 12/22/2020   BILITOT 0.3 12/22/2020   AST 15 12/22/2020   ALT 18 12/22/2020   PROT 7.4 12/22/2020   CALCIUM 9.7 12/22/2020   GFRAA 105 06/13/2020   QFTBGOLDPLUS NEGATIVE 05/02/2020    Speciality Comments: MTX since2004, Humira-2004-2017-quit working, cosyntex 2018x 8 months, restarted May 11, 2020   FU every 3 months  Procedures:  No procedures performed Allergies: Patient has no  known allergies.   Assessment / Plan:     Visit Diagnoses: No diagnosis found.  Orders: No orders of the defined types were placed in this encounter.  No orders of the defined types were placed in this encounter.   Face-to-face time spent with patient was *** minutes. Greater than 50% of time was spent in counseling and coordination of care.  Follow-Up Instructions: No follow-ups on file.   Earnestine Mealing, CMA  Note - This record has been created using Editor, commissioning.  Chart creation errors have been sought, but may not always  have been located. Such creation errors do not reflect on  the standard of medical care.

## 2021-03-17 NOTE — Progress Notes (Signed)
Office Visit Note  Patient: Sherry Stein             Date of Birth: 1970/10/17           MRN: [id number]             PCP: Barbarann Ehlers Referring: Jordan Hawks, PA-C Visit Date: 03/29/2021 Occupation: @GUAROCC @  Subjective:  Pain in multiple joints   History of Present Illness: Sherry Stein is a 51 y.o. female with history of psoriatic arthritis and osteoarthritis.  Patient reports that she has been off of Cosentyx and methotrexate since December 2022.  She states that there has been a delay in receiving her prescriptions due to needing to submit necessary financial paperwork for 2023 approval.  She states that she received her prescription for Rasuvo today but is still awaiting her shipment of Cosentyx.  During the gap in therapy, she has been experiencing increased pain and intermittent inflammation in both hands and both feet.  She has also had some increased neck pain and stiffness. She denies any symptoms of radiculopathy.  She has noticed scattered patches of psoriasis on both lower extremities as well as inverse psoriasis under her arms and in the genital region.  She denies any Achilles tendinitis or planter fasciitis.  She has not had any increased SI joint discomfort.  Activities of Daily Living:  Patient reports morning stiffness for 2-3 hours.   Patient Denies nocturnal pain.  Difficulty dressing/grooming: Denies Difficulty climbing stairs: Denies Difficulty getting out of chair: Denies Difficulty using hands for taps, buttons, cutlery, and/or writing: Reports  Review of Systems  Constitutional:  Positive for fatigue.  HENT:  Negative for mouth sores, mouth dryness and nose dryness.   Eyes:  Negative for pain, itching and dryness.  Respiratory:  Negative for shortness of breath and difficulty breathing.   Cardiovascular:  Negative for chest pain and palpitations.  Gastrointestinal:  Negative for blood in stool, constipation and diarrhea.   Endocrine: Negative for increased urination.  Genitourinary:  Negative for difficulty urinating.  Musculoskeletal:  Positive for joint pain, joint pain, joint swelling, myalgias, morning stiffness, muscle tenderness and myalgias.  Skin:  Positive for rash. Negative for color change.  Allergic/Immunologic: Negative for susceptible to infections.  Neurological:  Negative for dizziness, numbness, headaches, memory loss and weakness.  Hematological:  Negative for bruising/bleeding tendency.  Psychiatric/Behavioral:  Negative for confusion.    PMFS History:  Patient Active Problem List   Diagnosis Date Noted   Hyperlipidemia 08/10/2016   GAD (generalized anxiety disorder) 08/04/2016   History of autoimmune disorder 03/20/2016   Open leg wound, right, sequela 03/20/2016   Drug therapy 05/10/2015   Abnormal glucose 05/09/2015   Acute frontal sinusitis 05/09/2015   Allergic conjunctivitis 05/09/2015   Amenorrhea 05/09/2015   Allergic rhinitis 05/09/2015   Benign essential hypertension 05/09/2015   Depression 05/09/2015   Foot arch pain 05/09/2015   Edema 05/09/2015   Hiatal hernia with GERD without esophagitis 05/09/2015   Muscle spasm 05/09/2015   Pain in joint involving multiple sites 05/09/2015   Psoriasis 05/09/2015   Menorrhagia with irregular cycle 04/10/2015   Hammer toe of right foot 04/07/2015   Pain from implanted hardware 04/07/2015    Past Medical History:  Diagnosis Date   Arthritis    Diabetes mellitus without complication ([facility])    Hypertension     Family History  Problem Relation Age of Onset   Hypertension Mother    Diabetes Father  COPD Father    Hypertension Father    Cancer Sister    High Cholesterol Brother    Crohn's disease Son    Past Surgical History:  Procedure Laterality Date   BUNIONECTOMY Bilateral 2007   CARPAL TUNNEL RELEASE Right 07/2019   REPLACEMENT TOTAL KNEE Left 03/2019   REPLACEMENT TOTAL KNEE Right 12/2018   TOE SURGERY Right     great toe x3   TONSILLECTOMY AND ADENOIDECTOMY     TYMPANOSTOMY TUBE PLACEMENT     WISDOM TOOTH EXTRACTION     20s   Social History   Social History Narrative   ** Merged History Encounter **       Immunization History  Administered Date(s) Administered   PFIZER(Purple Top)SARS-COV-2 Vaccination 11/24/2019, 12/15/2019   Pfizer Covid-19 Vaccine Bivalent Booster 15yrs & up 03/01/2021     Objective: Vital Signs: BP 115/78 (BP Location: Left Arm, Patient Position: Sitting, Cuff Size: Large)    Pulse 67    Ht 5\' 3"  (1.6 m)    Wt 223 lb (101.2 kg)    BMI 39.50 kg/m    Physical Exam Vitals and nursing note reviewed.  Constitutional:      Appearance: She is well-developed.  HENT:     Head: Normocephalic and atraumatic.  Eyes:     Conjunctiva/sclera: Conjunctivae normal.  Cardiovascular:     Rate and Rhythm: Normal rate and regular rhythm.     Heart sounds: Normal heart sounds.  Pulmonary:     Effort: Pulmonary effort is normal.     Breath sounds: Normal breath sounds.  Abdominal:     General: Bowel sounds are normal.     Palpations: Abdomen is soft.  Musculoskeletal:     Cervical back: Normal range of motion.  Lymphadenopathy:     Cervical: No cervical adenopathy.  Skin:    General: Skin is warm and dry.     Capillary Refill: Capillary refill takes less than 2 seconds.  Neurological:     Mental Status: She is alert and oriented to person, place, and time.  Psychiatric:        Behavior: Behavior normal.     Musculoskeletal Exam: C-spine has slightly limited range of motion without rotation.  Trapezius muscle tension and tenderness bilaterally.  Shoulder joints, elbow joints, and wrist joints have good range of motion with no discomfort.  Synovial thickening of bilateral first through third MCP joints.  PIP and DIP thickening noted.  Hip joints have good range of motion with no groin pain.  Both knee replacements have good range of motion with no warmth or effusion.  No  evidence of Achilles tendinitis or plantar fasciitis at this time.  Synovial thickening of both ankle joints. Dactylitis of the left fourth toe.  Tenderness over the left first MTP joint.  Hammertoes noted.  CDAI Exam: CDAI Score: -- Patient Global: --; Provider Global: -- Swollen: --; Tender: -- Joint Exam 03/29/2021   No joint exam has been documented for this visit   There is currently no information documented on the homunculus. Go to the Rheumatology activity and complete the homunculus joint exam.  Investigation: No additional findings.  Imaging: No results found.  Recent Labs: Lab Results  Component Value Date   WBC 11.0 (H) 12/22/2020   HGB 15.9 (H) 12/22/2020   PLT 322 12/22/2020   NA 141 12/22/2020   K 4.2 12/22/2020   CL 103 12/22/2020   CO2 28 12/22/2020   GLUCOSE 83 12/22/2020   BUN 18  12/22/2020   CREATININE 0.76 12/22/2020   BILITOT 0.3 12/22/2020   AST 15 12/22/2020   ALT 18 12/22/2020   PROT 7.4 12/22/2020   CALCIUM 9.7 12/22/2020   GFRAA 105 06/13/2020   QFTBGOLDPLUS NEGATIVE 05/02/2020    Speciality Comments: MTX since2004, Humira-2004-2017-quit working, cosyntex 2018x 8 months, restarted May 11, 2020   FU every 3 months  Procedures:  No procedures performed Allergies: Patient has no known allergies.    Assessment / Plan:     Visit Diagnoses: Psoriatic arthritis ([place]) - Dxd in 2004 by Dr. Rockwell Alexandria.  She has been on methotrexate since 2004.  Humira 2004 01/2016, Cosentyx 2018 for 8 months.  She previously restarted Cosentyx on July 06, 2020:  She presents today with increased pain involving multiple joints especially her C-spine, hands, and both feet.  She has been off of Cosentyx and methotrexate since December 2022 due to needing to complete the patient portion of the [id number] applications.  Rasuvo has been approved and she received the shipment today but she is currently waiting on the approval for Cosentyx.  She has noticed some new patches of  scattered psoriasis during the gap in therapy.  She declined a prescription for a topical agent at this time.  Overall she found Cosentyx and methotrexate to be effective at managing her symptoms.  No medication changes will be made at this time.  She will remain on the current combination but was advised to notify us if she develops signs or symptoms of a flare.  She will follow-up in the office in 3 months.  Psoriasis - Scattered patches and inverse pattern.  Her psoriasis previously cleared well on Cosentyx but has started to flare with the gap in therapy as well as with cooler weather temperatures.  She has a few scattered patches of psoriasis on both lower extremities and in her ears.  She has also noticed some patches in the inguinal region as well as under both arms.  She declined a prescription for a topical agent at this time.  She plans on resuming Cosentyx and Rasuvo as prescribed once her prescriptions are available.  High risk medication use - Cosentyx 300 mg sq injections once every 28 days, rasuvo 25 mg sq injections once weekly, and folic acid 2 mg daily.  - Plan: QuantiFERON-TB Gold Plus, CBC with Differential/Platelet, COMPLETE METABOLIC PANEL WITH GFR TB gold negative on 05/02/2020.  Order for TB gold was released today.  CBC updated on 03/01/2021.  CMP drawn on 12/22/2020.  CBC and CMP will be updated today while the patient is in the office.  Her next lab work will be due in May and every 3 months to monitor for drug toxicity.  Standing orders for CBC and CMP remain in place. She is aware that she should hold Cosentyx and Rasuvo if she develops signs or symptoms of infection and to resume once the infection is completely cleared.  Screening for tuberculosis -Order for TB gold was released today.  Plan: QuantiFERON-TB Gold Plus  Primary osteoarthritis of both hands - X-rays of both hands obtained on 05/02/2020 were consistent with inflammatory arthritis and osteoarthritis overlap.  She  continues to have chronic pain and stiffness in both hands.  Synovial thickening of bilateral first through third MCP joints noted.  She has PIP and DIP thickening.  She has had some increased pain, stiffness, and inflammation in both hands while off of Cosentyx and methotrexate.  She will be restarting combination therapy once she has received  her Cosentyx shipment.  Bilateral sacroiliitis ([facility]) - Bilateral SI joint sclerosis was noted on the x-rays.  She had a left SI joint cortisone injection performed on 06/13/2020 provided significant relief.  She continues to experience intermittent discomfort in both SI joints but overall her symptoms have been tolerable.  She is hoping to get a hot tub, which alleviates her generalized arthralgias and joint stiffness.   Status post total knee replacement, bilateral - right total knee replacement November 2020 and left total knee replacement February 2021 by Dr. Laurance Flatten at [place].  Doing well.  She is good range of motion of both knee replacements at this time.  Pain in both feet - X-rays from 05/02/2020 were consistent with psoriatic arthritis.  She has chronic pain in both feet.  She has tried wearing orthotics with arch support in the past but it exacerbated her symptoms.  She has no evidence of Achilles tendinitis or planter fasciitis at this time.  Dactylitis of the left fourth toe was noted.  She plans on resuming Rasuvo as prescribed and Cosentyx once she has received her shipment.  She was advised to notify us if her symptoms persist or worsen once she has resumed therapy.  Other medical conditions are listed as follows:  Benign essential hypertension: Blood pressure was 115/78 today in the office.  Prediabetes  Mixed hyperlipidemia  Hiatal hernia with GERD without esophagitis  GAD (generalized anxiety disorder)  Family history of Crohn's disease - son  Smoker    Orders: Orders Placed This Encounter  Procedures   QuantiFERON-TB Gold Plus    CBC with Differential/Platelet   COMPLETE METABOLIC PANEL WITH GFR   No orders of the defined types were placed in this encounter.   Follow-Up Instructions: Return in about 3 months (around 06/26/2021) for Psoriatic arthritis, Osteoarthritis.   Ofilia Neas, PA-C  Note - This record has been created using Dragon software.  Chart creation errors have been sought, but may not always  have been located. Such creation errors do not reflect on  the standard of medical care.

## 2021-03-22 ENCOUNTER — Other Ambulatory Visit: Payer: Self-pay | Admitting: Pharmacist

## 2021-03-22 DIAGNOSIS — L409 Psoriasis, unspecified: Secondary | ICD-10-CM

## 2021-03-22 DIAGNOSIS — L4050 Arthropathic psoriasis, unspecified: Secondary | ICD-10-CM

## 2021-03-22 MED ORDER — COSENTYX SENSOREADY (300 MG) 150 MG/ML ~~LOC~~ SOAJ: 300.0000 mg | SUBCUTANEOUS | 0 refills | Status: DC

## 2021-03-22 NOTE — Telephone Encounter (Signed)
Submitted Patient Assistance RENEWAL Application to [employer] for [employer] along with provider portion, patient portion and income documents. Will update patient when we receive a response. Noted on cover sheet that patient is divorced and name is Sherry Stein. Income documents still state Sherry Stein which is former name  Fax# [phone] Phone# [phone]  Knox Saliva, PharmD, MPH, BCPS Clinical Pharmacist (Rheumatology and Pulmonology)

## 2021-03-22 NOTE — Telephone Encounter (Signed)
Patient dropped off her portion of Rasuvo PAP application, however income document name does not match her name in Epic or on forms. ATC patient to determine if name on income documents is married or maiden name. If married, she will need to submit income documents for both her and her spouse. Left VM requesting return call ASAP  Will submit application to prevent delay in processing at this point. Patient does not have prescription coverage so wi  Phone: [phone] Fax: [phone]  Chesley Mires, PharmD, MPH, BCPS Clinical Pharmacist (Rheumatology and Pulmonology)

## 2021-03-23 ENCOUNTER — Ambulatory Visit: Payer: Medicare Other | Admitting: Physician Assistant

## 2021-03-23 DIAGNOSIS — L4050 Arthropathic psoriasis, unspecified: Secondary | ICD-10-CM

## 2021-03-23 DIAGNOSIS — Z79899 Other long term (current) drug therapy: Secondary | ICD-10-CM

## 2021-03-23 DIAGNOSIS — Z96653 Presence of artificial knee joint, bilateral: Secondary | ICD-10-CM

## 2021-03-23 DIAGNOSIS — I10 Essential (primary) hypertension: Secondary | ICD-10-CM

## 2021-03-23 DIAGNOSIS — F411 Generalized anxiety disorder: Secondary | ICD-10-CM

## 2021-03-23 DIAGNOSIS — L409 Psoriasis, unspecified: Secondary | ICD-10-CM

## 2021-03-23 DIAGNOSIS — M19041 Primary osteoarthritis, right hand: Secondary | ICD-10-CM

## 2021-03-23 DIAGNOSIS — M461 Sacroiliitis, not elsewhere classified: Secondary | ICD-10-CM

## 2021-03-23 DIAGNOSIS — E782 Mixed hyperlipidemia: Secondary | ICD-10-CM

## 2021-03-23 DIAGNOSIS — K449 Diaphragmatic hernia without obstruction or gangrene: Secondary | ICD-10-CM

## 2021-03-23 DIAGNOSIS — Z8379 Family history of other diseases of the digestive system: Secondary | ICD-10-CM

## 2021-03-23 DIAGNOSIS — M79671 Pain in right foot: Secondary | ICD-10-CM

## 2021-03-23 DIAGNOSIS — R7303 Prediabetes: Secondary | ICD-10-CM

## 2021-03-23 NOTE — Telephone Encounter (Signed)
Received fax from [employer] that patient's Rasuvo application was received and is in process of being reviewed  Sherry Stein, PharmD, MPH, BCPS Clinical Pharmacist (Rheumatology and Pulmonology)

## 2021-03-24 ENCOUNTER — Telehealth: Payer: Self-pay | Admitting: Rheumatology

## 2021-03-24 NOTE — Telephone Encounter (Signed)
Patient left a voicemail requesting a return call from [facility].

## 2021-03-24 NOTE — Telephone Encounter (Signed)
Received a fax from  [employer] regarding an approval for COSENTYX patient assistance from 03/24/21 to 02/04/22.   Phone# [phone]  Chesley Mires, PharmD, MPH, BCPS Clinical Pharmacist (Rheumatology and Pulmonology)

## 2021-03-27 MED ORDER — RASUVO 25 MG/0.5ML ~~LOC~~ SOAJ: 25.0000 mg | SUBCUTANEOUS | 0 refills | Status: DC

## 2021-03-27 NOTE — Addendum Note (Signed)
Addended by: Murrell Redden on: 03/27/2021 11:58 AM   Modules accepted: Orders

## 2021-03-27 NOTE — Telephone Encounter (Signed)
Returned call to patient - she states she was approved through Rasuvo PAP through 02/04/22. She states that she is unsure when she is receiving shipment. She inquired if she needs to bring to her appt. I stated that she does not need to but we can show her how to use the pen again if she'd like. Advised her not to place Rasuvo in fridge. This will be the first time she is taking Rasuvo.  Chesley Mires, PharmD, MPH, BCPS Clinical Pharmacist (Rheumatology and Pulmonology)

## 2021-03-27 NOTE — Telephone Encounter (Addendum)
Received a verbal notification from patient that  [employer] regarding approved her for RASUVO patient assistance from 03/24/21 to 02/04/22.  She has shipment scheduled for later this week  Added Rasuvo as no-print to patient's med list today and removed MTX tabs  Chesley Mires, PharmD, MPH, BCPS Clinical Pharmacist (Rheumatology and Pulmonology)

## 2021-03-29 ENCOUNTER — Other Ambulatory Visit: Payer: Self-pay

## 2021-03-29 ENCOUNTER — Ambulatory Visit (INDEPENDENT_AMBULATORY_CARE_PROVIDER_SITE_OTHER): Payer: Medicare Other | Admitting: Physician Assistant

## 2021-03-29 ENCOUNTER — Encounter: Payer: Self-pay | Admitting: Physician Assistant

## 2021-03-29 VITALS — BP 115/78 | HR 67 | Ht 63.0 in | Wt 223.0 lb

## 2021-03-29 DIAGNOSIS — L409 Psoriasis, unspecified: Secondary | ICD-10-CM | POA: Diagnosis not present

## 2021-03-29 DIAGNOSIS — R7303 Prediabetes: Secondary | ICD-10-CM

## 2021-03-29 DIAGNOSIS — Z96653 Presence of artificial knee joint, bilateral: Secondary | ICD-10-CM

## 2021-03-29 DIAGNOSIS — Z79899 Other long term (current) drug therapy: Secondary | ICD-10-CM | POA: Diagnosis not present

## 2021-03-29 DIAGNOSIS — I10 Essential (primary) hypertension: Secondary | ICD-10-CM

## 2021-03-29 DIAGNOSIS — L4050 Arthropathic psoriasis, unspecified: Secondary | ICD-10-CM | POA: Diagnosis not present

## 2021-03-29 DIAGNOSIS — E782 Mixed hyperlipidemia: Secondary | ICD-10-CM

## 2021-03-29 DIAGNOSIS — F17200 Nicotine dependence, unspecified, uncomplicated: Secondary | ICD-10-CM

## 2021-03-29 DIAGNOSIS — M461 Sacroiliitis, not elsewhere classified: Secondary | ICD-10-CM

## 2021-03-29 DIAGNOSIS — Z111 Encounter for screening for respiratory tuberculosis: Secondary | ICD-10-CM

## 2021-03-29 DIAGNOSIS — K449 Diaphragmatic hernia without obstruction or gangrene: Secondary | ICD-10-CM

## 2021-03-29 DIAGNOSIS — M19041 Primary osteoarthritis, right hand: Secondary | ICD-10-CM

## 2021-03-29 DIAGNOSIS — F411 Generalized anxiety disorder: Secondary | ICD-10-CM

## 2021-03-29 DIAGNOSIS — K219 Gastro-esophageal reflux disease without esophagitis: Secondary | ICD-10-CM

## 2021-03-29 DIAGNOSIS — M79672 Pain in left foot: Secondary | ICD-10-CM

## 2021-03-29 DIAGNOSIS — M79671 Pain in right foot: Secondary | ICD-10-CM

## 2021-03-29 DIAGNOSIS — M19042 Primary osteoarthritis, left hand: Secondary | ICD-10-CM

## 2021-03-29 DIAGNOSIS — Z8379 Family history of other diseases of the digestive system: Secondary | ICD-10-CM

## 2021-03-29 NOTE — Patient Instructions (Signed)
Standing Labs °We placed an order today for your standing lab work.  ° °Please have your standing labs drawn in May and every 3 months  ° ° °If possible, please have your labs drawn 2 weeks prior to your appointment so that the provider can discuss your results at your appointment. ° °Please note that you may see your imaging and lab results in MyChart before we have reviewed them. °We may be awaiting multiple results to interpret others before contacting you. °Please allow our office up to 72 hours to thoroughly review all of the results before contacting the office for clarification of your results. ° °We have open lab daily: °Monday through Thursday from 1:30-4:30 PM and Friday from 1:30-4:00 PM °at the office of Dr. Shaili Deveshwar, [facility].   °Please be advised, all patients with office appointments requiring lab work will take precedent over walk-in lab work.  °If possible, please come for your lab work on Monday and Friday afternoons, as you may experience shorter wait times. °The office is located at [address], [address], [place], [place] [address] °No appointment is necessary.   °Labs are drawn by [facility]. Please bring your co-pay at the time of your lab draw.  You may receive a bill from [facility] for your lab work. ° °Please note if you are on Hydroxychloroquine and and an order has been placed for a Hydroxychloroquine level, you will need to have it drawn 4 hours or more after your last dose. ° °If you wish to have your labs drawn at another location, please call the office 24 hours in advance to send orders. ° °If you have any questions regarding directions or hours of operation,  °please call [phone].   °As a reminder, please drink plenty of water prior to coming for your lab work. Thanks! ° °

## 2021-03-30 ENCOUNTER — Telehealth: Payer: Self-pay | Admitting: Rheumatology

## 2021-03-30 NOTE — Progress Notes (Signed)
CMP WNL. CBC stable.

## 2021-03-30 NOTE — Telephone Encounter (Signed)
Leah from [facility] called the office regarding the patient. She stated that the [employer] PAP application did not have the patients legal name. It stated "Sherry Stein" on the application and the prescription sent with stated " [name]". She stated that they require a new PAP application with the patients legal name. They prefer to have the corrected application faxed to [phone].

## 2021-04-01 LAB — COMPLETE METABOLIC PANEL WITH GFR
AG Ratio: 1.8 (calc) (ref 1.0–2.5)
ALT: 15 U/L (ref 6–29)
AST: 27 U/L (ref 10–35)
Albumin: 4.4 g/dL (ref 3.6–5.1)
Alkaline phosphatase (APISO): 61 U/L (ref 37–153)
BUN: 21 mg/dL (ref 7–25)
CO2: 31 mmol/L (ref 20–32)
Calcium: 9.4 mg/dL (ref 8.6–10.4)
Chloride: 102 mmol/L (ref 98–110)
Creat: 0.65 mg/dL (ref 0.50–1.03)
Globulin: 2.5 g/dL (calc) (ref 1.9–3.7)
Glucose, Bld: 96 mg/dL (ref 65–99)
Potassium: 3.9 mmol/L (ref 3.5–5.3)
Sodium: 139 mmol/L (ref 135–146)
Total Bilirubin: 0.4 mg/dL (ref 0.2–1.2)
Total Protein: 6.9 g/dL (ref 6.1–8.1)
eGFR: 106 mL/min/{1.73_m2} (ref >=60)

## 2021-04-01 LAB — CBC WITH DIFFERENTIAL/PLATELET
Absolute Monocytes: 770 cells/uL (ref 200–950)
Basophils Absolute: 54 cells/uL (ref 0–200)
Basophils Relative: 0.5 %
Eosinophils Absolute: 417 cells/uL (ref 15–500)
Eosinophils Relative: 3.9 %
HCT: 45.3 % — ABNORMAL HIGH (ref 35.0–45.0)
Hemoglobin: 15.6 g/dL — ABNORMAL HIGH (ref 11.7–15.5)
Lymphs Abs: 2889 cells/uL (ref 850–3900)
MCH: 33.2 pg — ABNORMAL HIGH (ref 27.0–33.0)
MCHC: 34.4 g/dL (ref 32.0–36.0)
MCV: 96.4 fL (ref 80.0–100.0)
MPV: 10 fL (ref 7.5–12.5)
Monocytes Relative: 7.2 %
Neutro Abs: 6570 cells/uL (ref 1500–7800)
Neutrophils Relative %: 61.4 %
Platelets: 268 10*3/uL (ref 140–400)
RBC: 4.7 10*6/uL (ref 3.80–5.10)
RDW: 11.8 % (ref 11.0–15.0)
Total Lymphocyte: 27 %
WBC: 10.7 10*3/uL (ref 3.8–10.8)

## 2021-04-01 LAB — QUANTIFERON-TB GOLD PLUS
Mitogen-NIL: >10 IU/mL
NIL: 0.04 IU/mL
QuantiFERON-TB Gold Plus: NEGATIVE
TB1-NIL: <0 IU/mL
TB2-NIL: <0 IU/mL

## 2021-04-03 NOTE — Telephone Encounter (Signed)
Prescription was correctly prescribed for patient's legal name. Patient's name is legally Sherry Stein is her former married name). Prescriber form faxed with [name] which is only form that is needed per rep. Rep confirmed that rx is not needed since rx has correct name on it.  [phone] Phone: [phone]  Knox Saliva, PharmD, MPH, BCPS Clinical Pharmacist (Rheumatology and Pulmonology)

## 2021-04-03 NOTE — Progress Notes (Signed)
TB gold negative

## 2021-06-06 ENCOUNTER — Telehealth: Payer: Self-pay | Admitting: Pharmacist

## 2021-06-06 DIAGNOSIS — L409 Psoriasis, unspecified: Secondary | ICD-10-CM

## 2021-06-06 DIAGNOSIS — Z79899 Other long term (current) drug therapy: Secondary | ICD-10-CM

## 2021-06-06 DIAGNOSIS — L4050 Arthropathic psoriasis, unspecified: Secondary | ICD-10-CM

## 2021-06-06 MED ORDER — COSENTYX SENSOREADY (300 MG) 150 MG/ML ~~LOC~~ SOAJ: 300.0000 mg | SUBCUTANEOUS | 1 refills | Status: DC

## 2021-06-06 NOTE — Telephone Encounter (Signed)
Received VM from patient stating that she needs Cosentyx refilled. States she has been out of Cosentyx for 4 months though we sent an rx via fax on 03/22/21. Called [employer] PAP to clarify since patient should not have been out of medication for 4 months. Per rep, patient received last shipment on 03/24/21.  ? ?I called patient to advise and she states she never received medication. States she has never had issues with receiving packages at home. She states she was unaware that a package was coming to her home. She sates she didn't reach out to our clinic when she ran out because she kept forgetting to call and follow-up and then weeks turned to months. I advised her to reach out to [employer] by the end of the week for an update on her shipment and ETA. She verbalized understanding. ? ?Phone: [phone] ? ?She is due for labs 06/26/21 - has OV on 06/27/21 ? ?Knox Saliva, PharmD, MPH, BCPS, CPP ?Clinical Pharmacist (Rheumatology and Pulmonology) ?

## 2021-06-13 NOTE — Progress Notes (Signed)
Office Visit Note  Patient: Sherry Stein             Date of Birth: October 13, 1970           MRN: [id number]             PCP: Juanda Chance Referring: Mindi Curling, PA-C Visit Date: 06/27/2021 Occupation: @GUAROCC @  Subjective:  Increased joint pain and psoriasis  History of Present Illness: Sherry Stein is a 51 y.o. female with history of psoriatic arthritis, psoriasis and osteoarthritis.  She is to states she ran out of Cosentyx for about 3 months as the shipment was sent to her wrong address.  She resumed Cosentyx in May 2023.  She is having a flare with increased pain and discomfort in her right ring finger.  She states her right ring finger is also triggering.  She has discomfort in the left foot.  She denies any discomfort in the SI joints today.  Her knee replacements are doing well.  She is having psoriasis flare with a rash on her lower extremities and perineum.  Activities of Daily Living:  Patient reports morning stiffness for 2 hours.   Patient Denies nocturnal pain.  Difficulty dressing/grooming: Reports Difficulty climbing stairs: Denies Difficulty getting out of chair: Denies Difficulty using hands for taps, buttons, cutlery, and/or writing: Reports  Review of Systems  Constitutional:  Positive for fatigue.  HENT:  Negative for mouth dryness.   Eyes:  Negative for dryness.  Respiratory:  Negative for shortness of breath.   Cardiovascular:  Negative for swelling in legs/feet.  Gastrointestinal:  Negative for constipation.  Endocrine: Positive for heat intolerance.  Genitourinary:  Negative for difficulty urinating.  Musculoskeletal:  Positive for joint pain, joint pain, joint swelling, morning stiffness and muscle tenderness.  Skin:  Positive for rash.  Allergic/Immunologic: Positive for susceptible to infections.  Neurological:  Negative for numbness.  Hematological:  Negative for bruising/bleeding tendency.  Psychiatric/Behavioral:   Negative for sleep disturbance.    PMFS History:  Patient Active Problem List   Diagnosis Date Noted   Hyperlipidemia 08/10/2016   GAD (generalized anxiety disorder) 08/04/2016   History of autoimmune disorder 03/20/2016   Open leg wound, right, sequela 03/20/2016   Drug therapy 05/10/2015   Abnormal glucose 05/09/2015   Acute frontal sinusitis 05/09/2015   Allergic conjunctivitis 05/09/2015   Amenorrhea 05/09/2015   Allergic rhinitis 05/09/2015   Benign essential hypertension 05/09/2015   Depression 05/09/2015   Foot arch pain 05/09/2015   Edema 05/09/2015   Hiatal hernia with GERD without esophagitis 05/09/2015   Muscle spasm 05/09/2015   Pain in joint involving multiple sites 05/09/2015   Psoriasis 05/09/2015   Menorrhagia with irregular cycle 04/10/2015   Hammer toe of right foot 04/07/2015   Pain from implanted hardware 04/07/2015    Past Medical History:  Diagnosis Date   Arthritis    Diabetes mellitus without complication ([place])    Hypertension     Family History  Problem Relation Age of Onset   Hypertension Mother    Diabetes Father    COPD Father    Hypertension Father    Cancer Sister    High Cholesterol Brother    Crohn's disease Son    Past Surgical History:  Procedure Laterality Date   BUNIONECTOMY Bilateral 2007   CARPAL TUNNEL RELEASE Right 07/2019   REPLACEMENT TOTAL KNEE Left 03/2019   REPLACEMENT TOTAL KNEE Right 12/2018   TOE SURGERY Right    great toe  x3   TONSILLECTOMY AND ADENOIDECTOMY     TYMPANOSTOMY TUBE PLACEMENT     WISDOM TOOTH EXTRACTION     20s   Social History   Social History Narrative   ** Merged History Encounter **       Immunization History  Administered Date(s) Administered   PFIZER(Purple Top)SARS-COV-2 Vaccination 11/24/2019, 12/15/2019   Pfizer Covid-19 Office manager 57yrs & up 03/01/2021     Objective: Vital Signs: BP 106/70 (BP Location: Left Arm, Patient Position: Sitting, Cuff Size: Large)    Pulse 73   Resp 13   Ht 5\' 3"  (1.6 m)   Wt 219 lb 12.8 oz (99.7 kg)   BMI 38.94 kg/m    Physical Exam Vitals and nursing note reviewed.  Constitutional:      Appearance: She is well-developed.  HENT:     Head: Normocephalic and atraumatic.  Eyes:     Conjunctiva/sclera: Conjunctivae normal.  Cardiovascular:     Rate and Rhythm: Normal rate and regular rhythm.     Heart sounds: Normal heart sounds.  Pulmonary:     Effort: Pulmonary effort is normal.     Breath sounds: Normal breath sounds.  Abdominal:     General: Bowel sounds are normal.     Palpations: Abdomen is soft.  Musculoskeletal:     Cervical back: Normal range of motion.  Lymphadenopathy:     Cervical: No cervical adenopathy.  Skin:    General: Skin is warm and dry.     Capillary Refill: Capillary refill takes less than 2 seconds.     Comments: Psoriasis patches were noted on visit on bilateral lower extremities.  Neurological:     Mental Status: She is alert and oriented to person, place, and time.  Psychiatric:        Behavior: Behavior normal.     Musculoskeletal Exam: She had limited range of motion of the cervical spine.  Shoulder joints, elbow joints, wrist joints with good range of motion.  She has synovial thickening over bilateral MCPs and PIPs and DIP joints.  Synovitis was noted in some of the joints as described above.  Hip joints in good range of motion.  Both knee joints were replaced without any warmth swelling or effusion.  She had postsurgical changes in her bilateral feet with hammertoes.    CDAI Exam: CDAI Score: 11.2  Patient Global: 6 mm; Provider Global: 6 mm Swollen: 5 ; Tender: 5  Joint Exam 06/27/2021      Right  Left  MCP 1  Swollen Tender  Swollen Tender  MCP 2  Swollen Tender     MCP 3  Swollen Tender  Swollen Tender     Investigation: No additional findings.  Imaging: No results found.  Recent Labs: Lab Results  Component Value Date   WBC 10.7 03/29/2021   HGB 15.6  (H) 03/29/2021   PLT 268 03/29/2021   NA 139 03/29/2021   K 3.9 03/29/2021   CL 102 03/29/2021   CO2 31 03/29/2021   GLUCOSE 96 03/29/2021   BUN 21 03/29/2021   CREATININE 0.65 03/29/2021   BILITOT 0.4 03/29/2021   AST 27 03/29/2021   ALT 15 03/29/2021   PROT 6.9 03/29/2021   CALCIUM 9.4 03/29/2021   GFRAA 105 06/13/2020   QFTBGOLDPLUS NEGATIVE 03/29/2021    Speciality Comments: MTX since2004, Humira-2004-2017-quit working, cosyntex 2018x 8 months, restarted May 11, 2020   FU every 3 months  Procedures:  Hand/UE Inj: R ring A1 for trigger  finger on 06/27/2021 4:10 PM Indications: pain, tendon swelling and therapeutic Details: 27 G needle, ultrasound-guided volar approach Medications: 0.5 mL lidocaine 1 %; 10 mg triamcinolone acetonide 40 MG/ML Aspirate: 0 mL Procedure, treatment alternatives, risks and benefits explained, specific risks discussed. Immediately prior to procedure a time out was called to verify the correct patient, procedure, equipment, support staff and site/side marked as required. Patient was prepped and draped in the usual sterile fashion.    Allergies: Patient has no known allergies.   Assessment / Plan:     Visit Diagnoses: Psoriatic arthritis ([place]) - Dxd in 2004 by Dr. Rockwell [place].  She has been on methotrexate since 2004.  Humira 2004 01/2016, Cosentyx 2018 for 8 months.  Patient states that her Cosyntex was sent to the wrong address and she missed 3 months of Cosentyx.  She restarted Cosentyx in May 2023.  She is having a flare with increased pain and discomfort and swelling in her joints.  Her hands have been painful.  She also has developed a right ring trigger finger which has been painful.  Psoriasis - Scattered patches and inverse pattern.  She had rash on her bilateral lower extremities.  She states she has rash in her inguinal region as well.  She states the rash is improving since she resumed Cosentyx.  High risk medication use - Cosentyx 300 mg sq  injections once every 28 days, rasuvo 25 mg sq injections once weekly, and folic acid 2 mg daily.  Labs obtained March 29, 2021 showed normal CBC and CMP.  Hemoglobin was elevated probably secondary to chronic smoking.  TB gold was negative on March 29, 2021.-We will obtain labs today.  Plan: CBC with Differential/Platelet, COMPLETE METABOLIC PANEL WITH GFR.  She was advised to get labs every 3 months to monitor for drug toxicity.  Information regarding immunization was placed in the AVS.  She was advised to hold Cosentyx and methotrexate in case she develops an infection and resume after the infection resolves.  Primary osteoarthritis of both hands -she has severe psoriatic arthritis and osteoarthritis overlap.  Inflammation and synovitis was noted in multiple joints as described above.  X-rays of both hands obtained on 05/02/2020 were consistent with inflammatory arthritis and osteoarthritis overlap.   Trigger finger, right ring finger -she is having triggering of the right ring finger.  She is having severe pain and discomfort.  Different treatment options and their side effects were discussed.  After discussing indications side effects contraindications she wanted to proceed with the cortisone injection.  The procedure was described above.  She the procedure well.  Postprocedure instructions were given.  A splint was applied.  Plan: US Guided Needle Placement  Bilateral sacroiliitis ([place]) - Bilateral SI joint sclerosis was noted on the x-rays.  She had a left SI joint cortisone injection performed on 06/13/2020 provided significant relief.  She denies any discomfort today.  Status post total knee replacement, bilateral - right total knee replacement November 2020 and left total knee replacement February 2021 by Dr. Laurance Flatten at [facility].  Doing well.  Pain in both feet - X-rays from 05/02/2020 were consistent with psoriatic arthritis.  She has severe arthritis in her bilateral feet.  She had postsurgical  changes.  She continues to have ongoing pain and discomfort.  She will make appointment with an orthopedic surgeon.  Other medical problems are listed as follows:  Benign essential hypertension  Hiatal hernia with GERD without esophagitis  Mixed hyperlipidemia  Prediabetes  GAD (generalized anxiety disorder)  Family history of Crohn's disease - son  Orders: Orders Placed This Encounter  Procedures   US Guided Needle Placement   CBC with Differential/Platelet   COMPLETE METABOLIC PANEL WITH GFR   No orders of the defined types were placed in this encounter.    Follow-Up Instructions: Return in about 3 months (around 09/27/2021) for Psoriatic arthritis, Osteoarthritis.   Bo Merino, MD  Note - This record has been created using Editor, commissioning.  Chart creation errors have been sought, but may not always  have been located. Such creation errors do not reflect on  the standard of medical care.

## 2021-06-27 ENCOUNTER — Encounter: Payer: Self-pay | Admitting: Rheumatology

## 2021-06-27 ENCOUNTER — Ambulatory Visit: Payer: Self-pay

## 2021-06-27 ENCOUNTER — Ambulatory Visit (INDEPENDENT_AMBULATORY_CARE_PROVIDER_SITE_OTHER): Payer: Medicare Other | Admitting: Rheumatology

## 2021-06-27 VITALS — BP 106/70 | HR 73 | Resp 13 | Ht 63.0 in | Wt 219.8 lb

## 2021-06-27 DIAGNOSIS — I10 Essential (primary) hypertension: Secondary | ICD-10-CM

## 2021-06-27 DIAGNOSIS — K219 Gastro-esophageal reflux disease without esophagitis: Secondary | ICD-10-CM

## 2021-06-27 DIAGNOSIS — L4050 Arthropathic psoriasis, unspecified: Secondary | ICD-10-CM

## 2021-06-27 DIAGNOSIS — L408 Other psoriasis: Secondary | ICD-10-CM | POA: Diagnosis not present

## 2021-06-27 DIAGNOSIS — M19041 Primary osteoarthritis, right hand: Secondary | ICD-10-CM | POA: Diagnosis not present

## 2021-06-27 DIAGNOSIS — L409 Psoriasis, unspecified: Secondary | ICD-10-CM

## 2021-06-27 DIAGNOSIS — M79672 Pain in left foot: Secondary | ICD-10-CM

## 2021-06-27 DIAGNOSIS — R7303 Prediabetes: Secondary | ICD-10-CM

## 2021-06-27 DIAGNOSIS — L4059 Other psoriatic arthropathy: Secondary | ICD-10-CM

## 2021-06-27 DIAGNOSIS — Z96653 Presence of artificial knee joint, bilateral: Secondary | ICD-10-CM

## 2021-06-27 DIAGNOSIS — Z79899 Other long term (current) drug therapy: Secondary | ICD-10-CM

## 2021-06-27 DIAGNOSIS — M79671 Pain in right foot: Secondary | ICD-10-CM

## 2021-06-27 DIAGNOSIS — E782 Mixed hyperlipidemia: Secondary | ICD-10-CM

## 2021-06-27 DIAGNOSIS — M461 Sacroiliitis, not elsewhere classified: Secondary | ICD-10-CM

## 2021-06-27 DIAGNOSIS — K449 Diaphragmatic hernia without obstruction or gangrene: Secondary | ICD-10-CM

## 2021-06-27 DIAGNOSIS — M65341 Trigger finger, right ring finger: Secondary | ICD-10-CM

## 2021-06-27 DIAGNOSIS — M19042 Primary osteoarthritis, left hand: Secondary | ICD-10-CM

## 2021-06-27 DIAGNOSIS — Z8379 Family history of other diseases of the digestive system: Secondary | ICD-10-CM

## 2021-06-27 DIAGNOSIS — F411 Generalized anxiety disorder: Secondary | ICD-10-CM

## 2021-06-27 MED ORDER — LIDOCAINE HCL 1 % IJ SOLN
0.5000 mL | INTRAMUSCULAR | Status: AC | PRN
  Administered 2021-06-27: .5 mL

## 2021-06-27 MED ORDER — TRIAMCINOLONE ACETONIDE 40 MG/ML IJ SUSP
10.0000 mg | INTRAMUSCULAR | Status: AC | PRN
  Administered 2021-06-27: 10 mg

## 2021-06-27 NOTE — Patient Instructions (Signed)
Standing Labs We placed an order today for your standing lab work.   Please have your standing labs drawn in August  and every 3 months  If possible, please have your labs drawn 2 weeks prior to your appointment so that the provider can discuss your results at your appointment.  Please note that you may see your imaging and lab results in MyChart before we have reviewed them. We may be awaiting multiple results to interpret others before contacting you. Please allow our office up to 72 hours to thoroughly review all of the results before contacting the office for clarification of your results.  We have open lab daily: Monday through Thursday from 1:30-4:30 PM and Friday from 1:30-4:00 PM at the office of Dr. Tyreshia Ingman, [facility].   Please be advised, all patients with office appointments requiring lab work will take precedent over walk-in lab work.  If possible, please come for your lab work on Monday and Friday afternoons, as you may experience shorter wait times. The office is located at [address], [address], [place], [place] [address] No appointment is necessary.   Labs are drawn by [facility]. Please bring your co-pay at the time of your lab draw.  You may receive a bill from [facility] for your lab work.  Please note if you are on Hydroxychloroquine and and an order has been placed for a Hydroxychloroquine level, you will need to have it drawn 4 hours or more after your last dose.  If you wish to have your labs drawn at another location, please call the office 24 hours in advance to send orders.  If you have any questions regarding directions or hours of operation,  please call [phone].   As a reminder, please drink plenty of water prior to coming for your lab work. Thanks!   Vaccines You are taking a medication(s) that can suppress your immune system.  The following immunizations are recommended: Flu annually Covid-19  Td/Tdap (tetanus, diphtheria,  pertussis) every 10 years Pneumonia (Prevnar 15 then Pneumovax 23 at least 1 year apart.  Alternatively, can take Prevnar 20 without needing additional dose) Shingrix: 2 doses from 4 weeks to 6 months apart  Please check with your PCP to make sure you are up to date.   If you have signs or symptoms of an infection or start antibiotics: First, call your PCP for workup of your infection. Hold your medication through the infection, until you complete your antibiotics, and until symptoms resolve if you take the following: Injectable medication (Actemra, Benlysta, Cimzia, Cosentyx, Enbrel, Humira, Kevzara, Orencia, Remicade, Simponi, Stelara, Taltz, Tremfya) Methotrexate Leflunomide (Arava) Mycophenolate (Cellcept) Xeljanz, Olumiant, or Rinvoq  

## 2021-06-28 LAB — CBC WITH DIFFERENTIAL/PLATELET
Absolute Monocytes: 855 cells/uL (ref 200–950)
Basophils Absolute: 62 cells/uL (ref 0–200)
Basophils Relative: 0.6 %
Eosinophils Absolute: 412 cells/uL (ref 15–500)
Eosinophils Relative: 4 %
HCT: 46 % — ABNORMAL HIGH (ref 35.0–45.0)
Hemoglobin: 15.9 g/dL — ABNORMAL HIGH (ref 11.7–15.5)
Lymphs Abs: 2575 cells/uL (ref 850–3900)
MCH: 33.1 pg — ABNORMAL HIGH (ref 27.0–33.0)
MCHC: 34.6 g/dL (ref 32.0–36.0)
MCV: 95.6 fL (ref 80.0–100.0)
MPV: 10.5 fL (ref 7.5–12.5)
Monocytes Relative: 8.3 %
Neutro Abs: 6396 cells/uL (ref 1500–7800)
Neutrophils Relative %: 62.1 %
Platelets: 267 10*3/uL (ref 140–400)
RBC: 4.81 10*6/uL (ref 3.80–5.10)
RDW: 13.7 % (ref 11.0–15.0)
Total Lymphocyte: 25 %
WBC: 10.3 10*3/uL (ref 3.8–10.8)

## 2021-06-28 LAB — COMPLETE METABOLIC PANEL WITH GFR
AG Ratio: 2 (calc) (ref 1.0–2.5)
ALT: 20 U/L (ref 6–29)
AST: 17 U/L (ref 10–35)
Albumin: 4.9 g/dL (ref 3.6–5.1)
Alkaline phosphatase (APISO): 70 U/L (ref 37–153)
BUN/Creatinine Ratio: 38 (calc) — ABNORMAL HIGH (ref 6–22)
BUN: 29 mg/dL — ABNORMAL HIGH (ref 7–25)
CO2: 28 mmol/L (ref 20–32)
Calcium: 9.7 mg/dL (ref 8.6–10.4)
Chloride: 101 mmol/L (ref 98–110)
Creat: 0.76 mg/dL (ref 0.50–1.03)
Globulin: 2.5 g/dL (calc) (ref 1.9–3.7)
Glucose, Bld: 105 mg/dL — ABNORMAL HIGH (ref 65–99)
Potassium: 3.9 mmol/L (ref 3.5–5.3)
Sodium: 139 mmol/L (ref 135–146)
Total Bilirubin: 0.5 mg/dL (ref 0.2–1.2)
Total Protein: 7.4 g/dL (ref 6.1–8.1)
eGFR: 94 mL/min/{1.73_m2} (ref >=60)

## 2021-06-28 NOTE — Progress Notes (Signed)
Hemoglobin is high and stable.  CMP is normal.

## 2021-07-07 ENCOUNTER — Other Ambulatory Visit: Payer: Self-pay | Admitting: *Deleted

## 2021-07-07 DIAGNOSIS — Z79899 Other long term (current) drug therapy: Secondary | ICD-10-CM

## 2021-07-07 DIAGNOSIS — L4050 Arthropathic psoriasis, unspecified: Secondary | ICD-10-CM

## 2021-07-07 DIAGNOSIS — L409 Psoriasis, unspecified: Secondary | ICD-10-CM

## 2021-07-07 MED ORDER — COSENTYX SENSOREADY (300 MG) 150 MG/ML ~~LOC~~ SOAJ: 300.0000 mg | SUBCUTANEOUS | 2 refills | Status: DC

## 2021-07-07 NOTE — Telephone Encounter (Signed)
Refill request received via fax  Next Visit: 09/28/2021  Last Visit: 06/27/2021  Last Fill: 06/06/2021  IO:[id number] arthritis   Current Dose per office note 06/27/2021: Cosentyx 300 mg sq injections once every 28 days  Labs: 06/27/2021 Hemoglobin is high and stable.  CMP is normal.  TB Gold: 03/29/2021 Neg    Okay to refill Cosentyx??

## 2021-07-13 ENCOUNTER — Other Ambulatory Visit: Payer: Self-pay | Admitting: *Deleted

## 2021-07-13 DIAGNOSIS — L4050 Arthropathic psoriasis, unspecified: Secondary | ICD-10-CM

## 2021-07-13 DIAGNOSIS — L409 Psoriasis, unspecified: Secondary | ICD-10-CM

## 2021-07-13 MED ORDER — RASUVO 25 MG/0.5ML ~~LOC~~ SOAJ: 25.0000 mg | SUBCUTANEOUS | 0 refills | Status: DC

## 2021-07-13 NOTE — Telephone Encounter (Signed)
Refill request received via fax  Next Visit: 09/28/2021  Last Visit: 06/27/2021  DX:  Psoriatic arthritis   Current Dose per office note 06/27/2021: rasuvo 25 mg sq injections once weekly  Labs: 06/27/2021 Hemoglobin is high and stable.  CMP is normal.  Okay to refill Rasuvo?

## 2021-08-22 ENCOUNTER — Telehealth: Payer: Self-pay | Admitting: Rheumatology

## 2021-08-22 NOTE — Telephone Encounter (Signed)
Patient left a voicemail requesting a prescription refill of Cosentyx to be sent to [facility].  Patient also requested a return call to let her know if she can pick up a sample because she is due to take the medication this week.

## 2021-08-22 NOTE — Telephone Encounter (Signed)
Prescription sent to the pharmacy on July 07, 2021 for a 30 day supply with 2 additional refills. Patient provided with phone number to pharmacy and she will reach out to them. Patient advised to reach out to the office if she has any further issues to contact the office.

## 2021-09-15 NOTE — Progress Notes (Unsigned)
Office Visit Note  Patient: Sherry Stein             Date of Birth: 1970-04-06           MRN: [id number]             PCP: Barbarann Ehlers Referring: Jordan Hawks, PA-C Visit Date: 09/28/2021 Occupation: @GUAROCC @  Subjective:  Right thumb pain  History of Present Illness: Sherry Stein is a 51 y.o. female with history of psoriatic arthritis and osteoarthritis. She remains on Cosentyx 300 mg sq injections once every 28 days, rasuvo 25 mg sq injections once weekly, and folic acid 2 mg daily.  She is tolerating combination therapy without any side effects or injection site reactions.  Patient reports that her psoriatic arthritis and psoriasis have been better controlled during the summer months.  She states she is currently having some pain and swelling in her right thumb but denies any other joint swelling currently.  She states that her right ring trigger finger is no longer painful or locking since having a cortisone injection performed at her last office visit on 06/27/2021.  She denies any Achilles tendinitis or plantar fasciitis.  She has 2 small patches of psoriasis on her lower extremities but overall the psoriasis has been better controlled during the winter months.  She continues to have chronic pain in both feet and both SI joints.  Patient reports that she will be going on a cruise in November and would like to have SI joint injections prior to her departure.  She will be walking more than usual so she is concerned she may have a flare while on the cruise.  Patient would like a follow-up visit prior to leaving for her cruise for further evaluation and to determine if she may need a prednisone taper at that time. She denies any new medical conditions.  She has not had any recent infections.     Activities of Daily Living:  Patient reports morning stiffness for 1-2 hours.   Patient Denies nocturnal pain.  Difficulty dressing/grooming: Denies Difficulty  climbing stairs: Denies Difficulty getting out of chair: Denies Difficulty using hands for taps, buttons, cutlery, and/or writing: Reports  Review of Systems  Constitutional:  Negative for fatigue.  HENT:  Negative for mouth sores and mouth dryness.   Eyes:  Negative for dryness.  Respiratory:  Negative for shortness of breath.   Cardiovascular:  Negative for chest pain and palpitations.  Gastrointestinal:  Negative for blood in stool, constipation and diarrhea.  Endocrine: Negative for increased urination.  Genitourinary:  Negative for involuntary urination.  Musculoskeletal:  Positive for joint pain, joint pain, joint swelling and morning stiffness. Negative for gait problem, myalgias, muscle weakness, muscle tenderness and myalgias.  Skin:  Negative for color change, rash, hair loss and sensitivity to sunlight.  Allergic/Immunologic: Positive for susceptible to infections.  Neurological:  Negative for dizziness and headaches.  Hematological:  Negative for swollen glands.  Psychiatric/Behavioral:  Negative for depressed mood and sleep disturbance. The patient is not nervous/anxious.     PMFS History:  Patient Active Problem List   Diagnosis Date Noted   Hyperlipidemia 08/10/2016   GAD (generalized anxiety disorder) 08/04/2016   History of autoimmune disorder 03/20/2016   Open leg wound, right, sequela 03/20/2016   Drug therapy 05/10/2015   Abnormal glucose 05/09/2015   Acute frontal sinusitis 05/09/2015   Allergic conjunctivitis 05/09/2015   Amenorrhea 05/09/2015   Allergic rhinitis 05/09/2015   Benign essential  hypertension 05/09/2015   Depression 05/09/2015   Foot arch pain 05/09/2015   Edema 05/09/2015   Hiatal hernia with GERD without esophagitis 05/09/2015   Muscle spasm 05/09/2015   Pain in joint involving multiple sites 05/09/2015   Psoriasis 05/09/2015   Menorrhagia with irregular cycle 04/10/2015   Hammer toe of right foot 04/07/2015   Pain from implanted  hardware 04/07/2015    Past Medical History:  Diagnosis Date   Arthritis    Diabetes mellitus without complication (HCC)    Hypertension     Family History  Problem Relation Age of Onset   Hypertension Mother    Diabetes Father    COPD Father    Hypertension Father    Cancer Sister    High Cholesterol Brother    Crohn's disease Son    Past Surgical History:  Procedure Laterality Date   BUNIONECTOMY Bilateral 2007   CARPAL TUNNEL RELEASE Right 07/2019   REPLACEMENT TOTAL KNEE Left 03/2019   REPLACEMENT TOTAL KNEE Right 12/2018   TOE SURGERY Right    great toe x3   TONSILLECTOMY AND ADENOIDECTOMY     TYMPANOSTOMY TUBE PLACEMENT     WISDOM TOOTH EXTRACTION     20s   Social History   Social History Narrative   ** Merged History Encounter **       Immunization History  Administered Date(s) Administered   PFIZER(Purple Top)SARS-COV-2 Vaccination 11/24/2019, 12/15/2019   Pfizer Covid-19 Vaccine Bivalent Booster 62yrs & up 03/01/2021     Objective: Vital Signs: BP 113/78 (BP Location: Left Arm, Patient Position: Sitting, Cuff Size: Large)   Pulse 67   Resp 14   Ht 5\' 3"  (1.6 m)   Wt 219 lb 3.2 oz (99.4 kg)   BMI 38.83 kg/m    Physical Exam Vitals and nursing note reviewed.  Constitutional:      Appearance: She is well-developed.  HENT:     Head: Normocephalic and atraumatic.  Eyes:     Conjunctiva/sclera: Conjunctivae normal.  Cardiovascular:     Rate and Rhythm: Normal rate and regular rhythm.     Heart sounds: Normal heart sounds.  Pulmonary:     Effort: Pulmonary effort is normal.     Breath sounds: Normal breath sounds.  Abdominal:     General: Bowel sounds are normal.     Palpations: Abdomen is soft.  Musculoskeletal:     Cervical back: Normal range of motion.  Skin:    General: Skin is warm and dry.     Capillary Refill: Capillary refill takes less than 2 seconds.  Neurological:     Mental Status: She is alert and oriented to person, place, and  time.  Psychiatric:        Behavior: Behavior normal.      Musculoskeletal Exam: Limited range of motion of C-spine.  No midline spinal tenderness.  Some tenderness over both SI joints.  Shoulder joints and elbow joints have good range of motion with no discomfort.  No real thickening of MCP joints, PIP joints, DIP joints.  Synovitis of the right first MCP noted.  Hip joints have good range of motion with no discomfort.  Bilateral knee replacements have good range of motion with no warmth or effusion.  Ankle joints have good range of motion with no tenderness or synovitis.  Postsurgical changes in both feet with hammertoes noted.  No evidence of Achilles tendinitis or plantar fasciitis.  CDAI Exam: CDAI Score: -- Patient Global: --; Provider Global: -- Swollen: 1 ;  Tender: 3  Joint Exam 09/28/2021      Right  Left  MCP 1  Swollen Tender     Sacroiliac   Tender   Tender     Investigation: No additional findings.  Imaging: No results found.  Recent Labs: Lab Results  Component Value Date   WBC 10.3 06/27/2021   HGB 15.9 (H) 06/27/2021   PLT 267 06/27/2021   NA 139 06/27/2021   K 3.9 06/27/2021   CL 101 06/27/2021   CO2 28 06/27/2021   GLUCOSE 105 (H) 06/27/2021   BUN 29 (H) 06/27/2021   CREATININE 0.76 06/27/2021   BILITOT 0.5 06/27/2021   AST 17 06/27/2021   ALT 20 06/27/2021   PROT 7.4 06/27/2021   CALCIUM 9.7 06/27/2021   GFRAA 105 06/13/2020   QFTBGOLDPLUS NEGATIVE 03/29/2021    Speciality Comments: MTX since2004, Humira-2004-2017-quit working, cosyntex 2018x 8 months, restarted May 11, 2020   FU every 3 months  Procedures:  No procedures performed Allergies: Patient has no known allergies.   Assessment / Plan:     Visit Diagnoses: Psoriatic arthritis (HCC) - Dxd in 2004 by Dr. Coral Spikes.  She has been on methotrexate since 2004.  Humira 2004 01/2016, Cosentyx 2018 for 8 months: She has tenderness and synovitis of the right first MCP joint on examination  today.  She continues to have chronic pain in both SI joints and both feet.  No evidence of Achilles tendinitis or planter fasciitis at this time.  She has SI joint tenderness upon palpation bilaterally.  Overall her psoriatic arthritis has been better controlled during the summer months and since taking Cosentyx consistently and remaining on Rasuvo for combination therapy.  She continues to take diclofenac 75 mg daily for pain relief along with Cymbalta 60 mg daily.  She has not been experiencing any nocturnal pain.  Her morning stiffness continues to last 1 to 2 hours daily.  She has 2 very small patches of psoriasis on both legs but overall her psoriasis has cleared with UV exposure this summer.  She will remain on the current treatment regimen with close lab monitoring.  CBC and CMP will be updated today.  Discussed the risks of concurrent diclofenac and methotrexate use.  Patient states that her PCP plans on continuing to prescribe diclofenac for pain relief with close lab monitoring.  She was advised to notify us if she develops signs or symptoms of a flare.  She plans on following up in 3 months at which time she would like to have SI joint cortisone injections if her symptoms persist or worsen prior to her upcoming cruise.  Psoriasis: Well controlled. 2 small patches of psoriasis on each leg noted.   High risk medication use - Cosentyx 300 mg sq injections once every 28 days, rasuvo 25 mg sq injections once weekly, and folic acid 2 mg daily.   - Plan: CBC with Differential/Platelet, COMPLETE METABOLIC PANEL WITH GFR CBC and CMP updated on 06/27/21.  Orders for CBC and CMP released today. Her next lab work will be due in November and every 3 months.  TB gold negative on 03/29/21.  No recent or recurrent infections.  Discussed the importance of holding cosentyx and rasuvo if she develops signs or symptoms of an infection and to resume once the infection has completely cleared.  No new medical  conditions.  Primary osteoarthritis of both hands - X-rays of both hands obtained on 05/02/2020 were consistent with inflammatory arthritis and osteoarthritis overlap.   Trigger  finger, right ring finger: Resolved.  Patient had ultrasound-guided cortisone injection on 06/27/2021 which resolved her symptoms.  Bilateral sacroiliitis (HCC) - Bilateral SI joint sclerosis was noted on the x-rays.  She had a left SI joint cortisone injection performed on 06/13/2020 provided significant relief.  She has chronic pain in both SI joints.  Tenderness ovation over bilateral SI joints noted on examination today.  She would like to return in November to have SI joint injections prior to her scheduled cruise at the end of November.  Status post total knee replacement, bilateral - right total knee replacement November 2020 and left total knee replacement February 2021 by Dr. Christell Constant at [facility].  Doing well.  Good range of motion with no discomfort at this time.  Pain in both feet - X-rays from 05/02/2020 were consistent with psoriatic arthritis.  She has severe arthritis in her bilateral feet.  Chronic pain in both feet.  No evidence of Achilles tendinitis or planter fasciitis at this time.  Discussed the importance of wearing proper fitting shoes.  Other medical conditions are listed as follows:  Benign essential hypertension: Blood pressure was 113/78 today in the office.  Mixed hyperlipidemia  Hiatal hernia with GERD without esophagitis  GAD (generalized anxiety disorder)  Prediabetes  Family history of Crohn's disease - son.   Orders: Orders Placed This Encounter  Procedures   CBC with Differential/Platelet   COMPLETE METABOLIC PANEL WITH GFR   No orders of the defined types were placed in this encounter.    Follow-Up Instructions: Return in about 3 months (around 12/29/2021) for Psoriatic arthritis, Osteoarthritis.   Gearldine Bienenstock, PA-C  Note - This record has been created using Dragon  software.  Chart creation errors have been sought, but may not always  have been located. Such creation errors do not reflect on  the standard of medical care.

## 2021-09-28 ENCOUNTER — Ambulatory Visit: Payer: Medicare Other | Attending: Physician Assistant | Admitting: Physician Assistant

## 2021-09-28 ENCOUNTER — Encounter: Payer: Self-pay | Admitting: Physician Assistant

## 2021-09-28 VITALS — BP 113/78 | HR 67 | Resp 14 | Ht 63.0 in | Wt 219.2 lb

## 2021-09-28 DIAGNOSIS — I10 Essential (primary) hypertension: Secondary | ICD-10-CM

## 2021-09-28 DIAGNOSIS — M19042 Primary osteoarthritis, left hand: Secondary | ICD-10-CM | POA: Insufficient documentation

## 2021-09-28 DIAGNOSIS — M19041 Primary osteoarthritis, right hand: Secondary | ICD-10-CM | POA: Diagnosis not present

## 2021-09-28 DIAGNOSIS — Z79899 Other long term (current) drug therapy: Secondary | ICD-10-CM | POA: Insufficient documentation

## 2021-09-28 DIAGNOSIS — M79671 Pain in right foot: Secondary | ICD-10-CM | POA: Insufficient documentation

## 2021-09-28 DIAGNOSIS — K219 Gastro-esophageal reflux disease without esophagitis: Secondary | ICD-10-CM

## 2021-09-28 DIAGNOSIS — M79672 Pain in left foot: Secondary | ICD-10-CM | POA: Diagnosis present

## 2021-09-28 DIAGNOSIS — M461 Sacroiliitis, not elsewhere classified: Secondary | ICD-10-CM | POA: Diagnosis present

## 2021-09-28 DIAGNOSIS — F411 Generalized anxiety disorder: Secondary | ICD-10-CM | POA: Diagnosis present

## 2021-09-28 DIAGNOSIS — Z96653 Presence of artificial knee joint, bilateral: Secondary | ICD-10-CM | POA: Diagnosis present

## 2021-09-28 DIAGNOSIS — Z8379 Family history of other diseases of the digestive system: Secondary | ICD-10-CM

## 2021-09-28 DIAGNOSIS — R7303 Prediabetes: Secondary | ICD-10-CM | POA: Insufficient documentation

## 2021-09-28 DIAGNOSIS — L409 Psoriasis, unspecified: Secondary | ICD-10-CM | POA: Insufficient documentation

## 2021-09-28 DIAGNOSIS — E782 Mixed hyperlipidemia: Secondary | ICD-10-CM

## 2021-09-28 DIAGNOSIS — K449 Diaphragmatic hernia without obstruction or gangrene: Secondary | ICD-10-CM | POA: Diagnosis present

## 2021-09-28 DIAGNOSIS — L4050 Arthropathic psoriasis, unspecified: Secondary | ICD-10-CM | POA: Insufficient documentation

## 2021-09-28 DIAGNOSIS — M65341 Trigger finger, right ring finger: Secondary | ICD-10-CM | POA: Diagnosis present

## 2021-09-29 LAB — COMPLETE METABOLIC PANEL WITH GFR
AG Ratio: 1.8 (calc) (ref 1.0–2.5)
ALT: 24 U/L (ref 6–29)
AST: 24 U/L (ref 10–35)
Albumin: 4.6 g/dL (ref 3.6–5.1)
Alkaline phosphatase (APISO): 68 U/L (ref 37–153)
BUN: 20 mg/dL (ref 7–25)
CO2: 27 mmol/L (ref 20–32)
Calcium: 9.3 mg/dL (ref 8.6–10.4)
Chloride: 101 mmol/L (ref 98–110)
Creat: 0.76 mg/dL (ref 0.50–1.03)
Globulin: 2.5 g/dL (calc) (ref 1.9–3.7)
Glucose, Bld: 98 mg/dL (ref 65–99)
Potassium: 4 mmol/L (ref 3.5–5.3)
Sodium: 139 mmol/L (ref 135–146)
Total Bilirubin: 0.6 mg/dL (ref 0.2–1.2)
Total Protein: 7.1 g/dL (ref 6.1–8.1)
eGFR: 94 mL/min/{1.73_m2} (ref >=60)

## 2021-09-29 LAB — CBC WITH DIFFERENTIAL/PLATELET
Absolute Monocytes: 645 cells/uL (ref 200–950)
Basophils Absolute: 52 cells/uL (ref 0–200)
Basophils Relative: 0.5 %
Eosinophils Absolute: 322 cells/uL (ref 15–500)
Eosinophils Relative: 3.1 %
HCT: 45.5 % — ABNORMAL HIGH (ref 35.0–45.0)
Hemoglobin: 16 g/dL — ABNORMAL HIGH (ref 11.7–15.5)
Lymphs Abs: 2694 cells/uL (ref 850–3900)
MCH: 33.3 pg — ABNORMAL HIGH (ref 27.0–33.0)
MCHC: 35.2 g/dL (ref 32.0–36.0)
MCV: 94.8 fL (ref 80.0–100.0)
MPV: 10 fL (ref 7.5–12.5)
Monocytes Relative: 6.2 %
Neutro Abs: 6687 cells/uL (ref 1500–7800)
Neutrophils Relative %: 64.3 %
Platelets: 271 10*3/uL (ref 140–400)
RBC: 4.8 10*6/uL (ref 3.80–5.10)
RDW: 13 % (ref 11.0–15.0)
Total Lymphocyte: 25.9 %
WBC: 10.4 10*3/uL (ref 3.8–10.8)

## 2021-09-29 NOTE — Progress Notes (Signed)
CMP WNL. CBC stable.

## 2021-11-10 ENCOUNTER — Other Ambulatory Visit: Payer: Self-pay | Admitting: Rheumatology

## 2021-11-10 DIAGNOSIS — Z79899 Other long term (current) drug therapy: Secondary | ICD-10-CM

## 2021-11-10 DIAGNOSIS — L4050 Arthropathic psoriasis, unspecified: Secondary | ICD-10-CM

## 2021-11-10 DIAGNOSIS — L409 Psoriasis, unspecified: Secondary | ICD-10-CM

## 2021-11-10 MED ORDER — COSENTYX SENSOREADY (300 MG) 150 MG/ML ~~LOC~~ SOAJ: 300.0000 mg | SUBCUTANEOUS | 2 refills | Status: DC

## 2021-11-10 NOTE — Telephone Encounter (Signed)
Next Visit: 12/20/2021  Last Visit: 09/28/2021  Last Fill: 07/07/2021  DX: Psoriatic arthritis   Current Dose per office note 09/28/2021: Cosentyx 300 mg sq injections once every 28 days  Labs: 09/28/2021 CMP WNL.   CBC stable.   TB Gold: 03/29/2021 Neg    Okay to refill Cosentyx?

## 2021-11-10 NOTE — Telephone Encounter (Signed)
Patient called the office requesting a refill of Cosentyx be sent to [facility]

## 2021-11-16 ENCOUNTER — Other Ambulatory Visit: Payer: Self-pay | Admitting: Physician Assistant

## 2021-11-16 NOTE — Telephone Encounter (Signed)
Next Visit: 12/20/2021  Last Visit: 09/28/2021  Last Fill: 09/26/2020  Dx: Psoriatic arthritis   Current Dose per office note on 7/41/2878: folic acid 2 mg daily  Okay to refill Folic Acid?

## 2021-12-06 NOTE — Progress Notes (Signed)
Office Visit Note  Patient: Sherry Stein             Date of Birth: May 25, 1970           MRN: [id number]             PCP: Juanda Chance Referring: Mindi Curling, PA-C Visit Date: 12/20/2021 Occupation: @GUAROCC @  Subjective:  Arthralgias   History of Present Illness: Sherry Stein is a 51 y.o. female with history of psoriatic arthritis and osteoarthritis. She remains on Cosentyx 300 mg sq injections once every 28 days, rasuvo 25 mg sq injections once weekly, and folic acid 2 mg daily.  She has been tolerating combination therapy without any side effects.  She has not missed any doses recently.  She requested refills of Cosentyx and rasuvo to be sent to the pharmacy today.  She states she is currently having a flare in her right thumb.  She is also has some increased discomfort in both SI joints.  She states overall her feet pain has been tolerable since wearing of oofos sandals.  Patient reports that she will be leaving for a 6-day cruise next week and is concerned that her joint pain and inflammation will worsen with her increase in activity. She received the annual flu shot and the COVID-19 booster.   Activities of Daily Living:  Patient reports morning stiffness for 2-3 hours.   Patient Reports nocturnal pain.  Difficulty dressing/grooming: Denies Difficulty climbing stairs: Denies Difficulty getting out of chair: Denies Difficulty using hands for taps, buttons, cutlery, and/or writing: Reports  Review of Systems  Constitutional:  Positive for fatigue.  HENT:  Negative for mouth sores and mouth dryness.   Eyes:  Negative for dryness.  Respiratory:  Negative for shortness of breath.   Cardiovascular:  Negative for chest pain and palpitations.  Gastrointestinal:  Negative for blood in stool, constipation and diarrhea.  Endocrine: Negative for increased urination.  Genitourinary:  Negative for involuntary urination.  Musculoskeletal:  Positive for  joint pain, joint pain, joint swelling, myalgias, morning stiffness, muscle tenderness and myalgias. Negative for gait problem and muscle weakness.  Skin:  Negative for color change, rash, hair loss and sensitivity to sunlight.  Allergic/Immunologic: Negative for susceptible to infections.  Neurological:  Negative for dizziness and headaches.  Hematological:  Negative for swollen glands.  Psychiatric/Behavioral:  Negative for depressed mood and sleep disturbance. The patient is not nervous/anxious.     PMFS History:  Patient Active Problem List   Diagnosis Date Noted   Hyperlipidemia 08/10/2016   GAD (generalized anxiety disorder) 08/04/2016   History of autoimmune disorder 03/20/2016   Open leg wound, right, sequela 03/20/2016   Drug therapy 05/10/2015   Abnormal glucose 05/09/2015   Acute frontal sinusitis 05/09/2015   Allergic conjunctivitis 05/09/2015   Amenorrhea 05/09/2015   Allergic rhinitis 05/09/2015   Benign essential hypertension 05/09/2015   Depression 05/09/2015   Foot arch pain 05/09/2015   Edema 05/09/2015   Hiatal hernia with GERD without esophagitis 05/09/2015   Muscle spasm 05/09/2015   Pain in joint involving multiple sites 05/09/2015   Psoriasis 05/09/2015   Menorrhagia with irregular cycle 04/10/2015   Hammer toe of right foot 04/07/2015   Pain from implanted hardware 04/07/2015    Past Medical History:  Diagnosis Date   Arthritis    Diabetes mellitus without complication ([place])    Hypertension     Family History  Problem Relation Age of Onset   Hypertension Mother  Diabetes Father    COPD Father    Hypertension Father    Cancer Sister    High Cholesterol Brother    Crohn's disease Son    Past Surgical History:  Procedure Laterality Date   BUNIONECTOMY Bilateral 2007   CARPAL TUNNEL RELEASE Right 07/2019   REPLACEMENT TOTAL KNEE Left 03/2019   REPLACEMENT TOTAL KNEE Right 12/2018   TOE SURGERY Right    great toe x3   TONSILLECTOMY AND  ADENOIDECTOMY     TYMPANOSTOMY TUBE PLACEMENT     WISDOM TOOTH EXTRACTION     20s   Social History   Social History Narrative   ** Merged History Encounter **       Immunization History  Administered Date(s) Administered   PFIZER(Purple Top)SARS-COV-2 Vaccination 11/24/2019, 12/15/2019   Pfizer Covid-19 Office manager 45yrs & up 03/01/2021     Objective: Vital Signs: BP 127/80 (BP Location: Left Arm, Patient Position: Sitting, Cuff Size: Normal)   Pulse 68   Resp 18   Ht 5\' 4"  (1.626 m)   Wt 217 lb 6.4 oz (98.6 kg)   BMI 37.32 kg/m    Physical Exam Vitals and nursing note reviewed.  Constitutional:      Appearance: She is well-developed.  HENT:     Head: Normocephalic and atraumatic.  Eyes:     Conjunctiva/sclera: Conjunctivae normal.  Cardiovascular:     Rate and Rhythm: Normal rate and regular rhythm.     Heart sounds: Normal heart sounds.  Pulmonary:     Effort: Pulmonary effort is normal.     Breath sounds: Normal breath sounds.  Abdominal:     General: Bowel sounds are normal.     Palpations: Abdomen is soft.  Musculoskeletal:     Cervical back: Normal range of motion.  Skin:    General: Skin is warm and dry.     Capillary Refill: Capillary refill takes less than 2 seconds.  Neurological:     Mental Status: She is alert and oriented to person, place, and time.  Psychiatric:        Behavior: Behavior normal.      Musculoskeletal Exam: C-spine has limited range of motion with lateral rotation.  She has some trapezius muscle tension tenderness on the left side.  Shoulder joints have good range of motion with no discomfort.  Elbow joints have good range of motion with no tenderness or inflammation.  Wrist joints have good right range of motion with no tenderness or synovitis.  Subluxation of bilateral first MCP joints.  Tenderness and synovitis over the right first MCP and PIP joint.  PIP and DIP thickening consistent with osteoarthritis and cirrhotic  arthritis overlap noted.  Hip joints have good range of motion with no groin pain.  Bilateral knee replacements have good range of motion with no warmth or effusion.  No evidence of Achilles tendinitis or plantar fasciitis currently.  PIP and DIP thickening noted in both feet.  Postsurgical changes as well as hammertoes and a bunion noted bilaterally.  CDAI Exam: CDAI Score: -- Patient Global: --; Provider Global: -- Swollen: 2 ; Tender: 4  Joint Exam 12/20/2021      Right  Left  MCP 1  Swollen Tender     IP  Swollen Tender     Sacroiliac   Tender   Tender     Investigation: No additional findings.  Imaging: No results found.  Recent Labs: Lab Results  Component Value Date   WBC 10.4 09/28/2021  HGB 16.0 (H) 09/28/2021   PLT 271 09/28/2021   NA 139 09/28/2021   K 4.0 09/28/2021   CL 101 09/28/2021   CO2 27 09/28/2021   GLUCOSE 98 09/28/2021   BUN 20 09/28/2021   CREATININE 0.76 09/28/2021   BILITOT 0.6 09/28/2021   AST 24 09/28/2021   ALT 24 09/28/2021   PROT 7.1 09/28/2021   CALCIUM 9.3 09/28/2021   GFRAA 105 06/13/2020   QFTBGOLDPLUS NEGATIVE 03/29/2021    Speciality Comments: MTX since2004, Humira-2004-2017-quit working, cosyntex 2018x 8 months, restarted May 11, 2020   FU every 3 months  Procedures:  No procedures performed Allergies: Patient has no known allergies.    Assessment / Plan:     Visit Diagnoses: Psoriatic arthritis ([place]) - Dxd in 2004 by Dr. Rockwell [place].  She has been on methotrexate since 2004.  Humira 2004 01/2016, Cosentyx 2018 for 8 months: Patient presents today experiencing increased pain in both SI joints and is also having tenderness and synovitis in the right first MCP and PIP joint.  She remains on Cosentyx 300 mg sq injections every 28 days and Rasuvo 25 mg subcutaneous injections once weekly.  She is tolerating combination therapy without any side effects and has not missed any doses recently.  She is about to leave for a 6-day cruise  and is currently concerned that the flare will worsen with her increase in activity.  A prednisone taper starting at 20 mg tapering by 5 mg every 4 days was sent to the pharmacy.  Advised the patient to take prednisone in the morning with food and to avoid the use of NSAIDs while taking prednisone.  She will remain on Cosentyx and Rasuvo as prescribed.  Refills of both medications will be sent to the pharmacy today.  She will follow-up in the office in 3 months or sooner if needed.  Psoriasis: She has no active psoriasis at this time.   High risk medication use - Cosentyx 300 mg sq injections once every 28 days, rasuvo 25 mg sq injections once weekly, and folic acid 2 mg daily.  TB gold negative on 03/29/21.  Future order for TB Gold placed today. CBC and CMP updated on 09/28/21. Orders for CBC and CMP released today.  Her next lab work will be due in February and every 3 months to monitor for drug toxicity. Standing orders for CBC and CMP placed today.  She has not had any recent or recurrent infections.  Discussed the importance of holding Cosentyx and Rasuvo if she develops signs or symptoms of an infection and to resume once the infection has completely cleared.  She received the annual flu shot and the COVID-19 booster.  - Plan: CBC with Differential/Platelet, COMPLETE METABOLIC PANEL WITH GFR, CBC with Differential/Platelet, COMPLETE METABOLIC PANEL WITH GFR, QuantiFERON-TB Gold Plus  Screening for tuberculosis - Future order for TB gold placed today. Plan: QuantiFERON-TB Gold Plus  Primary osteoarthritis of both hands - X-rays of both hands obtained on 05/02/2020 were consistent with inflammatory arthritis and osteoarthritis overlap.  Tenderness and synovitis over the right first PIP and 1st MCP joint noted on examination today.  A prednisone taper was sent to the pharmacy.  Trigger finger, right ring finger - Resolved.  Patient had ultrasound-guided cortisone injection on 06/27/2021 which resolved  her symptoms.  Bilateral sacroiliitis (HCC) - Bilateral SI joint sclerosis was noted on the x-rays.  She has SI joint tenderness upon palpation bilaterally.    Status post total knee replacement, bilateral - right  total knee replacement November 2020 and left total knee replacement February 2021 by Dr. Laurance Flatten at [facility].  Doing well.  Good range of motion with no discomfort at this time.  Pain in both feet - X-rays from 05/02/2020 were consistent with psoriatic arthritis.  She has severe arthritis in her bilateral feet.  Overall the chronic pain in her feet has been more tolerable since starting to wear oofos sandals daily.   She has no active inflammation on examination today.   Other medical conditions are listed as follows:   Mixed hyperlipidemia  Benign essential hypertension: Blood pressure was 127/80 today in the office.  Hiatal hernia with GERD without esophagitis  Prediabetes  GAD (generalized anxiety disorder)  Family history of Crohn's disease    Orders: Orders Placed This Encounter  Procedures   CBC with Differential/Platelet   COMPLETE METABOLIC PANEL WITH GFR   CBC with Differential/Platelet   COMPLETE METABOLIC PANEL WITH GFR   QuantiFERON-TB Gold Plus   Meds ordered this encounter  Medications   predniSONE (DELTASONE) 5 MG tablet    Sig: Take 4 tablets by mouth daily x4 days, 3 tablets daily x4 days, 2 tablets daily x4 days, 1 tablet daily x4 days.    Dispense:  40 tablet    Refill:  0      Follow-Up Instructions: Return in about 3 months (around 03/22/2022) for Psoriatic arthritis, Osteoarthritis.   Ofilia Neas, PA-C  Note - This record has been created using Dragon software.  Chart creation errors have been sought, but may not always  have been located. Such creation errors do not reflect on  the standard of medical care.

## 2021-12-20 ENCOUNTER — Ambulatory Visit: Payer: Medicare Other | Attending: Physician Assistant | Admitting: Physician Assistant

## 2021-12-20 ENCOUNTER — Other Ambulatory Visit: Payer: Self-pay

## 2021-12-20 ENCOUNTER — Encounter: Payer: Self-pay | Admitting: Physician Assistant

## 2021-12-20 VITALS — BP 127/80 | HR 68 | Resp 18 | Ht 64.0 in | Wt 217.4 lb

## 2021-12-20 DIAGNOSIS — Z79899 Other long term (current) drug therapy: Secondary | ICD-10-CM

## 2021-12-20 DIAGNOSIS — L409 Psoriasis, unspecified: Secondary | ICD-10-CM

## 2021-12-20 DIAGNOSIS — Z111 Encounter for screening for respiratory tuberculosis: Secondary | ICD-10-CM | POA: Diagnosis present

## 2021-12-20 DIAGNOSIS — M19042 Primary osteoarthritis, left hand: Secondary | ICD-10-CM | POA: Insufficient documentation

## 2021-12-20 DIAGNOSIS — L4050 Arthropathic psoriasis, unspecified: Secondary | ICD-10-CM | POA: Diagnosis not present

## 2021-12-20 DIAGNOSIS — M19041 Primary osteoarthritis, right hand: Secondary | ICD-10-CM | POA: Diagnosis not present

## 2021-12-20 DIAGNOSIS — R7303 Prediabetes: Secondary | ICD-10-CM | POA: Diagnosis present

## 2021-12-20 DIAGNOSIS — I10 Essential (primary) hypertension: Secondary | ICD-10-CM | POA: Diagnosis present

## 2021-12-20 DIAGNOSIS — E782 Mixed hyperlipidemia: Secondary | ICD-10-CM | POA: Insufficient documentation

## 2021-12-20 DIAGNOSIS — F411 Generalized anxiety disorder: Secondary | ICD-10-CM | POA: Diagnosis present

## 2021-12-20 DIAGNOSIS — M79671 Pain in right foot: Secondary | ICD-10-CM | POA: Insufficient documentation

## 2021-12-20 DIAGNOSIS — M461 Sacroiliitis, not elsewhere classified: Secondary | ICD-10-CM | POA: Diagnosis present

## 2021-12-20 DIAGNOSIS — K449 Diaphragmatic hernia without obstruction or gangrene: Secondary | ICD-10-CM | POA: Diagnosis present

## 2021-12-20 DIAGNOSIS — Z8379 Family history of other diseases of the digestive system: Secondary | ICD-10-CM | POA: Insufficient documentation

## 2021-12-20 DIAGNOSIS — M79672 Pain in left foot: Secondary | ICD-10-CM | POA: Insufficient documentation

## 2021-12-20 DIAGNOSIS — M65341 Trigger finger, right ring finger: Secondary | ICD-10-CM | POA: Insufficient documentation

## 2021-12-20 DIAGNOSIS — Z96653 Presence of artificial knee joint, bilateral: Secondary | ICD-10-CM | POA: Insufficient documentation

## 2021-12-20 DIAGNOSIS — K219 Gastro-esophageal reflux disease without esophagitis: Secondary | ICD-10-CM | POA: Diagnosis present

## 2021-12-20 LAB — COMPLETE METABOLIC PANEL WITH GFR
AG Ratio: 2 (calc) (ref 1.0–2.5)
ALT: 16 U/L (ref 6–29)
AST: 14 U/L (ref 10–35)
Albumin: 4.5 g/dL (ref 3.6–5.1)
Alkaline phosphatase (APISO): 61 U/L (ref 37–153)
BUN: 16 mg/dL (ref 7–25)
CO2: 31 mmol/L (ref 20–32)
Calcium: 9 mg/dL (ref 8.6–10.4)
Chloride: 102 mmol/L (ref 98–110)
Creat: 0.72 mg/dL (ref 0.50–1.03)
Globulin: 2.2 g/dL (calc) (ref 1.9–3.7)
Glucose, Bld: 96 mg/dL (ref 65–99)
Potassium: 4 mmol/L (ref 3.5–5.3)
Sodium: 139 mmol/L (ref 135–146)
Total Bilirubin: 0.5 mg/dL (ref 0.2–1.2)
Total Protein: 6.7 g/dL (ref 6.1–8.1)
eGFR: 100 mL/min/{1.73_m2} (ref >=60)

## 2021-12-20 LAB — CBC WITH DIFFERENTIAL/PLATELET
Absolute Monocytes: 558 cells/uL (ref 200–950)
Basophils Absolute: 47 cells/uL (ref 0–200)
Basophils Relative: 0.5 %
Eosinophils Absolute: 316 cells/uL (ref 15–500)
Eosinophils Relative: 3.4 %
HCT: 43.2 % (ref 35.0–45.0)
Hemoglobin: 14.8 g/dL (ref 11.7–15.5)
Lymphs Abs: 2911 cells/uL (ref 850–3900)
MCH: 33.2 pg — ABNORMAL HIGH (ref 27.0–33.0)
MCHC: 34.3 g/dL (ref 32.0–36.0)
MCV: 96.9 fL (ref 80.0–100.0)
MPV: 10 fL (ref 7.5–12.5)
Monocytes Relative: 6 %
Neutro Abs: 5468 cells/uL (ref 1500–7800)
Neutrophils Relative %: 58.8 %
Platelets: 303 10*3/uL (ref 140–400)
RBC: 4.46 10*6/uL (ref 3.80–5.10)
RDW: 13.1 % (ref 11.0–15.0)
Total Lymphocyte: 31.3 %
WBC: 9.3 10*3/uL (ref 3.8–10.8)

## 2021-12-20 MED ORDER — RASUVO 25 MG/0.5ML ~~LOC~~ SOAJ: 25.0000 mg | SUBCUTANEOUS | 0 refills | Status: DC

## 2021-12-20 MED ORDER — PREDNISONE 5 MG PO TABS: ORAL_TABLET | ORAL | 0 refills | Status: DC

## 2021-12-20 MED ORDER — COSENTYX SENSOREADY (300 MG) 150 MG/ML ~~LOC~~ SOAJ: 300.0000 mg | SUBCUTANEOUS | 0 refills | Status: DC

## 2021-12-20 NOTE — Progress Notes (Unsigned)
Please review and sign pended refills for cosentyx and rasuvo. Thanks!

## 2021-12-21 NOTE — Progress Notes (Signed)
CBC and CMP are normal.

## 2022-01-02 ENCOUNTER — Telehealth: Payer: Self-pay

## 2022-01-02 NOTE — Telephone Encounter (Signed)
LVM with patient to verify that she received COSENTYX and RASUVO renewal information for 2024. No answer, will f/u.  Georgeann Oppenheim [employer] PharmD Candidate [phone]

## 2022-01-04 NOTE — Telephone Encounter (Signed)
Received letter from [employer] with RASUVO PAP renewal. Will mail to patient today with Cosentyx NOVARTIS PAP renewal application. Note states that income documents are required.  [employer] Case # for Rasuvo: [id number]  Chesley Mires, PharmD, MPH, BCPS, CPP Clinical Pharmacist (Rheumatology and Pulmonology)

## 2022-01-04 NOTE — Telephone Encounter (Signed)
ATC patient. Phone was picked up and then a hold line (?) starting playing music. Will continue to f/u  Chesley Mires, PharmD, MPH, BCPS, CPP Clinical Pharmacist (Rheumatology and Pulmonology)

## 2022-01-31 NOTE — Telephone Encounter (Signed)
Spoke with patient. She states she did receive application. Will plan to complete and return to clinic. Advised that income documents are required for both applications and they won't be processed completely without the income.  She verbalized understanding. She will collect information and drop off tomorrow.  Chesley Mires, PharmD, MPH, BCPS, CPP Clinical Pharmacist (Rheumatology and Pulmonology)

## 2022-02-06 ENCOUNTER — Telehealth: Payer: Self-pay | Admitting: Pharmacist

## 2022-02-06 NOTE — Telephone Encounter (Signed)
Received patient's Cosentyx (Novartis PAP) and Rasuvo ([employer] PAP) application. Submitted Patient Assistance RENEWAL Application to [employer] for RASUVO along with provider portion, patient portion, med list, and income documents. Pt does not have rx coverage (only Medicare A/B), Will update patient when we receive a response.  Fax# [phone] Phone# [phone]  Knox Saliva, PharmD, MPH, BCPS, CPP Clinical Pharmacist (Rheumatology and Pulmonology)

## 2022-02-06 NOTE — Telephone Encounter (Signed)
Submitted Patient Assistance RENEWAL Application to [employer] for [employer] along with provider portion, patient portion, med list, and income documents. Patient does not have rx coverage (only Medicare A/B). Will update patient when we receive a response.  Fax# [phone] Phone# [phone]  Knox Saliva, PharmD, MPH, BCPS, CPP Clinical Pharmacist (Rheumatology and Pulmonology)

## 2022-02-07 NOTE — Telephone Encounter (Signed)
Received a fax from  [employer] regarding an approval for [place] patient assistance from 02/07/2022 to 02/05/2023. Approval letter sent to scan center.  Phone number: [phone] Patient ID # [id number]  Knox Saliva, PharmD, MPH, BCPS, CPP Clinical Pharmacist (Rheumatology and Pulmonology)

## 2022-02-08 NOTE — Telephone Encounter (Signed)
Received a fax from  [employer] regarding an approval for [place] patient assistance from 02/08/2022 to 02/05/2023. Approval letter sent to scan center.  Phone number: [phone] Case # [id number]  Knox Saliva, PharmD, MPH, BCPS, CPP Clinical Pharmacist (Rheumatology and Pulmonology)

## 2022-02-09 ENCOUNTER — Telehealth: Payer: Self-pay | Admitting: Internal Medicine

## 2022-02-09 DIAGNOSIS — L4050 Arthropathic psoriasis, unspecified: Secondary | ICD-10-CM

## 2022-02-09 MED ORDER — PREDNISONE 5 MG PO TABS: ORAL_TABLET | ORAL | 0 refills | Status: AC

## 2022-02-09 NOTE — Telephone Encounter (Signed)
I spoke with Sherry Stein she is having acute worsening of pain, swelling, and stiffness in her left hand particularly 2nd MCP joint. This started abruptly this morning, symptoms were at baseline yesterday and did not recall any injury or activity. She took her rasuvo dose as usual today. Last prednisone course was finished a month ago. She had for increased joint and sciatica pain while walking a lot more than usual on her cruise. Sent new Rx for prednisone taper starting 20 mg daily down by 5 mg each 3 days. Also forwarding note to [facility] and Dr. Estanislado Pandy for follow up.

## 2022-02-12 ENCOUNTER — Telehealth: Payer: Self-pay | Admitting: *Deleted

## 2022-02-12 NOTE — Telephone Encounter (Addendum)
Patient received a prednisone taper from Dr. Benjamine Mola on January 5.  Please call the patient to check if her symptoms are improving.  Does she need an earlier appointment?  Thank you,  Bo Merino, MD   Attempted to contact the patient and left message for patient to call the office.

## 2022-02-12 NOTE — Telephone Encounter (Signed)
-----   Message from Bo Merino, MD sent at 02/12/2022  1:40 PM EST ----- Patient received a prednisone taper from Dr. Benjamine Mola on January 5.  Please call the patient to check if her symptoms are improving.  Does she need an earlier appointment? Thank you, Bo Merino, MD  ----- Message ----- From: Collier Salina, MD Sent: 02/09/2022   6:13 PM EST To: Bo Merino, MD; Ofilia Neas, PA-C

## 2022-02-12 NOTE — Telephone Encounter (Signed)
I called patient, patient is improving.  

## 2022-03-15 NOTE — Progress Notes (Signed)
Office Visit Note  Patient: Sherry Stein             Date of Birth: 10-Aug-1970           MRN: [id number]             PCP: Juanda Chance Referring: Mindi Curling, PA-C Visit Date: 03/29/2022 Occupation: @GUAROCC$ @  Subjective:   Medication maintenance   History of Present Illness: Sherry Stein is a 52 y.o. female with history of psoriatic arthritis and osteoarthritis. She is on Cosentyx 300 mg every 28 days, methotrexate 25 mg injections once weekly and folic acid  2 mg QD. She missed one dose of Cosentyx in January due to financial aid reasons and had a flare at this time which has resolved. Psoriatic arthritis is a 7/10 today, with her pain being in her right thumb and right ring finger which started flaring up a week ago. Right ring trigger finger is bothersome again today. She had a steroid injection in 06/2021 and completely resolved but started flaring up again 2-3 weeks ago. She has pain in her feet and lower back with prolonged walking which is unchanged. She's had several foot surgeries and tries to wear comfortable shoes. She has followed up with orthopedics but does not want surgery on her feet at this time.   Activities of Daily Living:  Patient reports morning stiffness for 2 hours.   Patient Denies nocturnal pain.  Difficulty dressing/grooming: Denies Difficulty climbing stairs: Denies Difficulty getting out of chair: Denies Difficulty using hands for taps, buttons, cutlery, and/or writing: Denies  Review of Systems  Constitutional:  Positive for fatigue.  HENT: Negative.  Negative for mouth sores and mouth dryness.   Eyes: Negative.  Negative for dryness.  Respiratory: Negative.  Negative for shortness of breath.   Cardiovascular: Negative.  Negative for chest pain and palpitations.  Gastrointestinal: Negative.  Negative for blood in stool, constipation and diarrhea.  Endocrine: Negative.  Negative for increased urination.  Genitourinary:  Negative.  Negative for involuntary urination.  Musculoskeletal:  Positive for joint pain, joint pain, joint swelling, morning stiffness and muscle tenderness. Negative for gait problem, myalgias, muscle weakness and myalgias.  Skin:  Positive for rash. Negative for color change, hair loss and sensitivity to sunlight.  Allergic/Immunologic: Negative.  Negative for susceptible to infections.  Neurological: Negative.  Negative for dizziness and headaches.  Hematological: Negative.  Negative for swollen glands.  Psychiatric/Behavioral:  Positive for depressed mood. Negative for sleep disturbance. The patient is nervous/anxious.     PMFS History:  Patient Active Problem List   Diagnosis Date Noted   Hyperlipidemia 08/10/2016   GAD (generalized anxiety disorder) 08/04/2016   History of autoimmune disorder 03/20/2016   Open leg wound, right, sequela 03/20/2016   Drug therapy 05/10/2015   Abnormal glucose 05/09/2015   Acute frontal sinusitis 05/09/2015   Allergic conjunctivitis 05/09/2015   Amenorrhea 05/09/2015   Allergic rhinitis 05/09/2015   Benign essential hypertension 05/09/2015   Depression 05/09/2015   Foot arch pain 05/09/2015   Edema 05/09/2015   Hiatal hernia with GERD without esophagitis 05/09/2015   Muscle spasm 05/09/2015   Pain in joint involving multiple sites 05/09/2015   Psoriasis 05/09/2015   Menorrhagia with irregular cycle 04/10/2015   Hammer toe of right foot 04/07/2015   Pain from implanted hardware 04/07/2015    Past Medical History:  Diagnosis Date   Arthritis    Diabetes mellitus without complication ([place])    Hypertension  Family History  Problem Relation Age of Onset   Hypertension Mother    Diabetes Father    COPD Father    Hypertension Father    Cancer Sister    High Cholesterol Brother    Crohn's disease Son    Past Surgical History:  Procedure Laterality Date   BUNIONECTOMY Bilateral 2007   CARPAL TUNNEL RELEASE Right 07/2019    REPLACEMENT TOTAL KNEE Left 03/2019   REPLACEMENT TOTAL KNEE Right 12/2018   TOE SURGERY Right    great toe x3   TONSILLECTOMY AND ADENOIDECTOMY     TYMPANOSTOMY TUBE PLACEMENT     WISDOM TOOTH EXTRACTION     20s   Social History   Social History Narrative   ** Merged History Encounter **       Immunization History  Administered Date(s) Administered   COVID-19, mRNA, vaccine(Comirnaty)12 years and older 11/28/2021   PFIZER(Purple Top)SARS-COV-2 Vaccination 11/24/2019, 12/15/2019   Pfizer Covid-19 Vaccine Bivalent Booster 21yr & up 03/01/2021     Objective: Vital Signs: BP 112/79 (BP Location: Left Arm, Patient Position: Sitting, Cuff Size: Large)   Pulse 71   Resp 16   Ht 5' 4"$  (1.626 m)   Wt 217 lb (98.4 kg)   BMI 37.25 kg/m    Physical Exam Vitals and nursing note reviewed.  Constitutional:      Appearance: She is well-developed.  HENT:     Head: Normocephalic and atraumatic.  Eyes:     Conjunctiva/sclera: Conjunctivae normal.  Cardiovascular:     Rate and Rhythm: Normal rate and regular rhythm.     Heart sounds: Normal heart sounds.  Pulmonary:     Effort: Pulmonary effort is normal.     Breath sounds: Normal breath sounds.  Abdominal:     General: Bowel sounds are normal.     Palpations: Abdomen is soft.  Musculoskeletal:     Cervical back: Normal range of motion.  Lymphadenopathy:     Cervical: No cervical adenopathy.  Skin:    General: Skin is warm and dry.     Capillary Refill: Capillary refill takes less than 2 seconds.  Neurological:     Mental Status: She is alert and oriented to person, place, and time.  Psychiatric:        Behavior: Behavior normal.     Musculoskeletal Exam: Cervical spine was in good range of motion. Shoulder joints, elbow joints, wrist joints, MCPs PIPs and DIPs have good range of motion.  She has synovial thickening over bilateral second MCP joints with synovitis over her right first and second MCPs.  Synovitis over the  right fourth PIP joint was noted.  Thickening of the right fourth flexor tendon tendon sheath was noted.  Hip joints, knee joints, ankles, MTPs and PIPs had no synovitis or effusion. Warmth of the knee joints bilaterally. Hammer toes bilaterally.   CDAI Exam: CDAI Score: -- Patient Global: --; Provider Global: -- Swollen: --; Tender: -- Joint Exam 03/29/2022   No joint exam has been documented for this visit   There is currently no information documented on the homunculus. Go to the Rheumatology activity and complete the homunculus joint exam.  Investigation: No additional findings.  Imaging: [place]Guided Needle Placement  Result Date: 03/29/2022 Ultrasound guided injection is preferred based studies that show increased duration, increased effect, greater accuracy, decreased procedural pain, increased response rate, and decreased cost with ultrasound guided versus blind injection.   Verbal informed consent obtained.  Time-out conducted.  Noted no overlying  erythema, induration, or other signs of local infection. Ultrasound-guided trigger finger injection: After sterile prep with Betadine, injected 0.5 ml of 1% lidocaine and 10 mg Kenalog using a 27-gauge needle, in the flexor tendon sheath.     Recent Labs: Lab Results  Component Value Date   WBC 9.3 12/20/2021   HGB 14.8 12/20/2021   PLT 303 12/20/2021   NA 139 12/20/2021   K 4.0 12/20/2021   CL 102 12/20/2021   CO2 31 12/20/2021   GLUCOSE 96 12/20/2021   BUN 16 12/20/2021   CREATININE 0.72 12/20/2021   BILITOT 0.5 12/20/2021   AST 14 12/20/2021   ALT 16 12/20/2021   PROT 6.7 12/20/2021   CALCIUM 9.0 12/20/2021   GFRAA 105 06/13/2020   QFTBGOLDPLUS NEGATIVE 03/29/2021    Speciality Comments: MTX since2004, Humira-2004-2017-quit working, cosyntex 2018x 8 months, restarted May 11, 2020   FU every 3 months  Procedures:  No procedures performed Allergies: Patient has no known allergies.   Assessment / Plan:     Visit  Diagnoses: Psoriatic arthritis ([place]) - Dxd in 2004 by Dr. Rockwell [place].  She has been on methotrexate since 2004.  Humira discontinued in 01/2016, Cosentyx 2018 for 8 months. Current regimen is Cosentyx 300 mg every 28 days, methotrexate 25 mg injections once weekly and folic acid  2 mg QD. She missed a dose of Cosentyx in January due to financial aid difficulties and had a flare which initially improved once she restarted the treatment but now she has inflammation of the first first and second MCP joints, right fourth PIP joint.  She also had warmth in her bilateral knee joints.  She rates her psoriatic arthritis as a 7/10 today.  We discussed possible prednisone taper for the flare.  Indications side effects contraindications of prednisone were discussed at length.  Patient wanted to proceed with prednisone taper due to ongoing pain and inflammation.  Initiate prednisone 5 mg tablet, 4 tablets x 4 days, 3 tablets x 4 days, 2 tablets x 4 days, 1 tablet x 4 days. Discussed side effects of medication.   Psoriasis - No recent flares of psoriasis. States she has nail pitting under her nail polish but this is unchanged.   High risk medication use - Cosentyx 300 mg sq injections once every 28 days, rasuvo 25 mg sq injections once weekly, and folic acid 2 mg daily. She missed a dose of cosentyx in January due to financial aid difficulties but has resumed treatment. TB gold negative on 03/29/21. CBC and CMP in 12/2021 were WNL. Repeat CBC, CMP with GFR and TB gold today. Patient is aware to hold the medication if she develops infection or needs surgery.  Information on immunization was placed in the AVS.  Primary osteoarthritis of both hands -  X-rays of both hands obtained on 05/02/2020 were consistent with inflammatory arthritis and osteoarthritis overlap. Physical exam revealed DIP and PIP changes consistent with osteoarthritis.  She had synovitis in her right fourth PIP joint and right first MCP joint today.  Trigger  finger, right ring finger - ultrasound-guided cortisone injection on 06/27/2021 which helped for several months but started to bother patient again 2-3 weeks ago. Discussed different treatment options and the side effects.  Risk versus benefit of repeat steroid injection under ultrasound guidance and patient agreed to proceed with steroid injection. Side effects were discussed. Procedure performed as stated above. Patient tolerated well.  Postprocedure instructions were given.  A splint was applied.  Status post total knee replacement,  bilateral - right total knee replacement November 2020 and left total knee replacement February 2021 by Dr. Laurance Flatten at [facility]. Not bothersome for patient. Warmth present bilaterally.  Pain in both feet - Unchanged. Hammer toes bilaterally. Patient follows up with orthopedics as needed.   Bilateral sacroiliitis ([place]) -she has intermittent discomfort.  She uses flexeril 10 mg QHS PRN.   Benign essential hypertension - Blood pressure was 112/79 today.   Other medical problems:   Family history of Crohn's disease  Mixed hyperlipidemia  Prediabetes  Hiatal hernia with GERD without esophagitis  GAD (generalized anxiety disorder)  Smoker  Orders: Orders Placed This Encounter  Procedures   US Guided Needle Placement   CBC with Differential/Platelet   COMPLETE METABOLIC PANEL WITH GFR   QuantiFERON-TB Gold Plus   No orders of the defined types were placed in this encounter.   Follow-Up Instructions: Return in about 5 months (around 08/27/2022) for Psoriatic arthritis.   Bo Merino, MD  Note - This record has been created using Editor, commissioning.  Chart creation errors have been sought, but may not always  have been located. Such creation errors do not reflect on  the standard of medical care.

## 2022-03-29 ENCOUNTER — Encounter: Payer: Self-pay | Admitting: Rheumatology

## 2022-03-29 ENCOUNTER — Ambulatory Visit (INDEPENDENT_AMBULATORY_CARE_PROVIDER_SITE_OTHER): Payer: Medicare Other

## 2022-03-29 ENCOUNTER — Ambulatory Visit: Payer: Medicare Other | Attending: Rheumatology | Admitting: Rheumatology

## 2022-03-29 ENCOUNTER — Other Ambulatory Visit: Payer: Self-pay | Admitting: *Deleted

## 2022-03-29 VITALS — BP 112/79 | HR 71 | Resp 16 | Ht 64.0 in | Wt 217.0 lb

## 2022-03-29 DIAGNOSIS — K219 Gastro-esophageal reflux disease without esophagitis: Secondary | ICD-10-CM

## 2022-03-29 DIAGNOSIS — L4050 Arthropathic psoriasis, unspecified: Secondary | ICD-10-CM

## 2022-03-29 DIAGNOSIS — L409 Psoriasis, unspecified: Secondary | ICD-10-CM | POA: Diagnosis present

## 2022-03-29 DIAGNOSIS — Z8379 Family history of other diseases of the digestive system: Secondary | ICD-10-CM

## 2022-03-29 DIAGNOSIS — R7303 Prediabetes: Secondary | ICD-10-CM

## 2022-03-29 DIAGNOSIS — K449 Diaphragmatic hernia without obstruction or gangrene: Secondary | ICD-10-CM | POA: Diagnosis present

## 2022-03-29 DIAGNOSIS — M19041 Primary osteoarthritis, right hand: Secondary | ICD-10-CM

## 2022-03-29 DIAGNOSIS — E782 Mixed hyperlipidemia: Secondary | ICD-10-CM | POA: Diagnosis present

## 2022-03-29 DIAGNOSIS — F17200 Nicotine dependence, unspecified, uncomplicated: Secondary | ICD-10-CM | POA: Diagnosis present

## 2022-03-29 DIAGNOSIS — M461 Sacroiliitis, not elsewhere classified: Secondary | ICD-10-CM | POA: Diagnosis present

## 2022-03-29 DIAGNOSIS — Z96653 Presence of artificial knee joint, bilateral: Secondary | ICD-10-CM | POA: Diagnosis present

## 2022-03-29 DIAGNOSIS — M79671 Pain in right foot: Secondary | ICD-10-CM

## 2022-03-29 DIAGNOSIS — M79672 Pain in left foot: Secondary | ICD-10-CM

## 2022-03-29 DIAGNOSIS — M65341 Trigger finger, right ring finger: Secondary | ICD-10-CM

## 2022-03-29 DIAGNOSIS — I10 Essential (primary) hypertension: Secondary | ICD-10-CM | POA: Diagnosis present

## 2022-03-29 DIAGNOSIS — M19042 Primary osteoarthritis, left hand: Secondary | ICD-10-CM | POA: Diagnosis present

## 2022-03-29 DIAGNOSIS — Z79899 Other long term (current) drug therapy: Secondary | ICD-10-CM

## 2022-03-29 DIAGNOSIS — F411 Generalized anxiety disorder: Secondary | ICD-10-CM | POA: Diagnosis present

## 2022-03-29 MED ORDER — PREDNISONE 5 MG PO TABS: ORAL_TABLET | ORAL | 0 refills | Status: DC

## 2022-03-29 NOTE — Patient Instructions (Signed)
Standing Labs We placed an order today for your standing lab work.   Please have your standing labs drawn in May and every 3 months  Please have your labs drawn 2 weeks prior to your appointment so that the provider can discuss your lab results at your appointment.  Please note that you may see your imaging and lab results in [place] before we have reviewed them. We will contact you once all results are reviewed. Please allow our office up to 72 hours to thoroughly review all of the results before contacting the office for clarification of your results.  Lab hours are:   Monday through Thursday from 8:00 am -12:30 pm and 1:00 pm-5:00 pm and Friday from 8:00 am-12:00 pm.  Please be advised, all patients with office appointments requiring labs will take precedent over walk-in labs.   Unfortunately you may encounter longer than normal wait times, possibly up to 1 hour. You may encounter a shorter wait time on Monday and Thursday afternoons.  We appreciate your patience and understanding.   Labs are drawn by [facility]. Please bring your co-pay at the time of your lab draw.  You may receive a bill from [facility] for your lab work.  Please note if you are on Hydroxychloroquine and and an order has been placed for a Hydroxychloroquine level, you will need to have it drawn 4 hours or more after your last dose.  If you wish to have your labs drawn at another location, please call the office 24 hours in advance so we can fax the orders.  The office is located at [address] [place], [place], [place] [address] No appointment is necessary.    If you have any questions regarding directions or hours of operation,  please call [phone].   As a reminder, please drink plenty of water prior to coming for your lab work. Thanks!   Vaccines You are taking a medication(s) that can suppress your immune system.  The following immunizations are recommended: Flu annually Covid-19  RSV Td/Tdap (tetanus,  diphtheria, pertussis) every 10 years Pneumonia (Prevnar 15 then Pneumovax 23 at least 1 year apart.  Alternatively, can take Prevnar 20 without needing additional dose) Shingrix: 2 doses from 4 weeks to 6 months apart  Please check with your PCP to make sure you are up to date.   If you have signs or symptoms of an infection or start antibiotics: First, call your PCP for workup of your infection. Hold your medication through the infection, until you complete your antibiotics, and until symptoms resolve if you take the following: Injectable medication (Actemra, Benlysta, Cimzia, Cosentyx, Enbrel, Humira, Kevzara, Orencia, Remicade, Simponi, Stelara, Taltz, Tremfya) Methotrexate Leflunomide (Arava) Mycophenolate (Cellcept) Morrie Sheldon, Olumiant, or Rinvoq

## 2022-03-30 NOTE — Progress Notes (Signed)
CBC and CMP are stable.

## 2022-03-31 LAB — QUANTIFERON-TB GOLD PLUS
Mitogen-NIL: >10 IU/mL
NIL: 0.03 IU/mL
QuantiFERON-TB Gold Plus: NEGATIVE
TB1-NIL: <0 IU/mL
TB2-NIL: <0 IU/mL

## 2022-03-31 LAB — COMPLETE METABOLIC PANEL WITH GFR
AG Ratio: 1.8 (calc) (ref 1.0–2.5)
ALT: 19 U/L (ref 6–29)
AST: 16 U/L (ref 10–35)
Albumin: 4.4 g/dL (ref 3.6–5.1)
Alkaline phosphatase (APISO): 62 U/L (ref 37–153)
BUN: 17 mg/dL (ref 7–25)
CO2: 26 mmol/L (ref 20–32)
Calcium: 9.4 mg/dL (ref 8.6–10.4)
Chloride: 104 mmol/L (ref 98–110)
Creat: 0.67 mg/dL (ref 0.50–1.03)
Globulin: 2.5 g/dL (calc) (ref 1.9–3.7)
Glucose, Bld: 97 mg/dL (ref 65–99)
Potassium: 4.2 mmol/L (ref 3.5–5.3)
Sodium: 140 mmol/L (ref 135–146)
Total Bilirubin: 0.4 mg/dL (ref 0.2–1.2)
Total Protein: 6.9 g/dL (ref 6.1–8.1)
eGFR: 104 mL/min/{1.73_m2} (ref >=60)

## 2022-03-31 LAB — CBC WITH DIFFERENTIAL/PLATELET
Absolute Monocytes: 616 cells/uL (ref 200–950)
Basophils Absolute: 44 cells/uL (ref 0–200)
Basophils Relative: 0.5 %
Eosinophils Absolute: 317 cells/uL (ref 15–500)
Eosinophils Relative: 3.6 %
HCT: 45.6 % — ABNORMAL HIGH (ref 35.0–45.0)
Hemoglobin: 15.6 g/dL — ABNORMAL HIGH (ref 11.7–15.5)
Lymphs Abs: 2992 cells/uL (ref 850–3900)
MCH: 32.8 pg (ref 27.0–33.0)
MCHC: 34.2 g/dL (ref 32.0–36.0)
MCV: 95.8 fL (ref 80.0–100.0)
MPV: 10.1 fL (ref 7.5–12.5)
Monocytes Relative: 7 %
Neutro Abs: 4831 cells/uL (ref 1500–7800)
Neutrophils Relative %: 54.9 %
Platelets: 257 10*3/uL (ref 140–400)
RBC: 4.76 10*6/uL (ref 3.80–5.10)
RDW: 13.2 % (ref 11.0–15.0)
Total Lymphocyte: 34 %
WBC: 8.8 10*3/uL (ref 3.8–10.8)

## 2022-04-02 NOTE — Progress Notes (Signed)
TB Gold is negative.

## 2022-05-30 ENCOUNTER — Other Ambulatory Visit: Payer: Self-pay | Admitting: *Deleted

## 2022-05-30 DIAGNOSIS — L409 Psoriasis, unspecified: Secondary | ICD-10-CM

## 2022-05-30 DIAGNOSIS — Z79899 Other long term (current) drug therapy: Secondary | ICD-10-CM

## 2022-05-30 DIAGNOSIS — L4050 Arthropathic psoriasis, unspecified: Secondary | ICD-10-CM

## 2022-05-30 MED ORDER — COSENTYX SENSOREADY (300 MG) 150 MG/ML ~~LOC~~ SOAJ
300.0000 mg | SUBCUTANEOUS | 0 refills | Status: AC
Start: 2022-05-30 — End: ?

## 2022-05-30 NOTE — Telephone Encounter (Signed)
Refill request received via fax from [facility] for Cosentyx   Last Fill: 12/20/2021  Labs: 03/29/2022 CBC and CMP are stable.   TB Gold: 03/29/2022 Neg    Next Visit: 07/03/2022  Last Visit: 03/29/2022  [id number] arthritis   Current Dose per office note 03/29/2022: Cosentyx 300 mg sq injections once every 28 days   Okay to refill Cosentyx?

## 2022-06-19 NOTE — Progress Notes (Signed)
Office Visit Note  Patient: Sherry Stein             Date of Birth: [birth year]           MRN: [id number]             PCP: Barbarann Ehlers Referring: Jordan Hawks, PA-C Visit Date: 07/03/2022 Occupation: @GUAROCC @  Subjective:  Medication monitoring   History of Present Illness: Sherry Stein is a 52 y.o. female with history of psoriatic arthritis.  Patient remains on Cosentyx 300 mg sq injections once every 28 days, rasuvo 25 mg sq injections once weekly, and folic acid 2 mg daily.  She is tolerating combination therapy without any side effects or injection site reactions.  She denies any recent psoriatic arthritis flares.  She denies any active psoriasis at this time.  Patient states she is has been experiencing increased discomfort in her lower back.  She has been under the care of Dr. Wilmon Pali.  She had an MRI of the lumbar spine on 06/21/2022.  She underwent a fluoroscopy guided nerve block with pain management on 06/29/2022 which provided about 95% improvement in her symptoms.  She will be going back for a repeat injection in June.  She will also be starting physical therapy on 07/19/22.   She denies any recurrent infections.     Activities of Daily Living:  Patient reports morning stiffness for 1 hour.   Patient Denies nocturnal pain.  Difficulty dressing/grooming: Denies Difficulty climbing stairs: Denies Difficulty getting out of chair: Denies Difficulty using hands for taps, buttons, cutlery, and/or writing: Reports  Review of Systems  Constitutional:  Positive for fatigue.  HENT:  Negative for mouth sores and mouth dryness.   Eyes:  Negative for dryness.  Respiratory:  Negative for shortness of breath.   Cardiovascular:  Negative for chest pain and palpitations.  Gastrointestinal:  Negative for blood in stool, constipation and diarrhea.  Endocrine: Negative for increased urination.  Genitourinary:  Negative for involuntary urination.   Musculoskeletal:  Positive for joint pain, joint pain, myalgias, morning stiffness, muscle tenderness and myalgias. Negative for gait problem, joint swelling and muscle weakness.  Skin:  Positive for sensitivity to sunlight. Negative for color change, rash and hair loss.  Allergic/Immunologic: Positive for susceptible to infections.  Neurological:  Negative for dizziness and headaches.  Hematological:  Negative for swollen glands.  Psychiatric/Behavioral:  Negative for depressed mood and sleep disturbance. The patient is not nervous/anxious.     PMFS History:  Patient Active Problem List   Diagnosis Date Noted   Hyperlipidemia 08/10/2016   GAD (generalized anxiety disorder) 08/04/2016   History of autoimmune disorder 03/20/2016   Open leg wound, right, sequela 03/20/2016   Drug therapy 05/10/2015   Abnormal glucose 05/09/2015   Acute frontal sinusitis 05/09/2015   Allergic conjunctivitis 05/09/2015   Amenorrhea 05/09/2015   Allergic rhinitis 05/09/2015   Benign essential hypertension 05/09/2015   Depression 05/09/2015   Foot arch pain 05/09/2015   Edema 05/09/2015   Hiatal hernia with GERD without esophagitis 05/09/2015   Muscle spasm 05/09/2015   Pain in joint involving multiple sites 05/09/2015   Psoriasis 05/09/2015   Menorrhagia with irregular cycle 04/10/2015   Hammer toe of right foot 04/07/2015   Pain from implanted hardware 04/07/2015    Past Medical History:  Diagnosis Date   Arthritis    Diabetes mellitus without complication (HCC)    Hypertension     Family History  Problem Relation Age of  Onset   Hypertension Mother    Diabetes Father    COPD Father    Hypertension Father    Cancer Sister    High Cholesterol Brother    Crohn's disease Son    Past Surgical History:  Procedure Laterality Date   BUNIONECTOMY Bilateral 2007   CARPAL TUNNEL RELEASE Right 07/2019   REPLACEMENT TOTAL KNEE Left 03/2019   REPLACEMENT TOTAL KNEE Right 12/2018   TOE SURGERY  Right    great toe x3   TONSILLECTOMY AND ADENOIDECTOMY     TYMPANOSTOMY TUBE PLACEMENT     WISDOM TOOTH EXTRACTION     20s   Social History   Social History Narrative   ** Merged History Encounter **       Immunization History  Administered Date(s) Administered   COVID-19, mRNA, vaccine(Comirnaty)12 years and older 11/28/2021   PFIZER(Purple Top)SARS-COV-2 Vaccination 11/24/2019, 12/15/2019   Pfizer Covid-19 Vaccine Bivalent Booster 60yrs & up 03/01/2021     Objective: Vital Signs: BP 119/77 (BP Location: Left Arm, Patient Position: Sitting, Cuff Size: Normal)   Pulse 66   Resp 17   Ht 5\' 3"  (1.6 m)   Wt 212 lb 3.2 oz (96.3 kg)   BMI 37.59 kg/m    Physical Exam Vitals and nursing note reviewed.  Constitutional:      Appearance: She is well-developed.  HENT:     Head: Normocephalic and atraumatic.  Eyes:     Conjunctiva/sclera: Conjunctivae normal.  Cardiovascular:     Rate and Rhythm: Normal rate and regular rhythm.     Heart sounds: Normal heart sounds.  Pulmonary:     Effort: Pulmonary effort is normal.     Breath sounds: Normal breath sounds.  Abdominal:     General: Bowel sounds are normal.     Palpations: Abdomen is soft.  Musculoskeletal:     Cervical back: Normal range of motion.  Lymphadenopathy:     Cervical: No cervical adenopathy.  Skin:    General: Skin is warm and dry.     Capillary Refill: Capillary refill takes less than 2 seconds.  Neurological:     Mental Status: She is alert and oriented to person, place, and time.  Psychiatric:        Behavior: Behavior normal.      Musculoskeletal Exam: C-spine has good range of motion.  Limited mobility of the lumbar spine.  Shoulder joints, elbow joints, wrist joints, MCPs, PIPs, DIPs have good range of motion.  Synovial thickening over bilateral second MCP joints and PIP and DIP joints.  Hip joints have good range of motion with no groin pain.  Knee replacements have good range of motion with warmth  but no effusion.  Ankle joints have good range of motion with no tenderness or synovitis.  Hammertoes noted along with overcrowding of toes.  No evidence of Achilles tendinitis or plantar fasciitis at this time.  Pes planus noted bilaterally.  CDAI Exam: CDAI Score: -- Patient Global: --; Provider Global: -- Swollen: --; Tender: -- Joint Exam 07/03/2022   No joint exam has been documented for this visit   There is currently no information documented on the homunculus. Go to the Rheumatology activity and complete the homunculus joint exam.  Investigation: No additional findings.  Imaging: No results found.  Recent Labs: Lab Results  Component Value Date   WBC 8.8 03/29/2022   HGB 15.6 (H) 03/29/2022   PLT 257 03/29/2022   NA 140 03/29/2022   K 4.2 03/29/2022  CL 104 03/29/2022   CO2 26 03/29/2022   GLUCOSE 97 03/29/2022   BUN 17 03/29/2022   CREATININE 0.67 03/29/2022   BILITOT 0.4 03/29/2022   AST 16 03/29/2022   ALT 19 03/29/2022   PROT 6.9 03/29/2022   CALCIUM 9.4 03/29/2022   GFRAA 105 06/13/2020   QFTBGOLDPLUS NEGATIVE 03/29/2022    Speciality Comments: MTX since2004, Humira-2004-2017-quit working, cosyntex 2018x 8 months, restarted May 11, 2020   FU every 3 months  Procedures:  No procedures performed Allergies: Patient has no known allergies.   Assessment / Plan:     Visit Diagnoses: Psoriatic arthritis (HCC) - Dxd in 2004 by Dr. Coral Spikes.  She has been on methotrexate since 2004.  Humira discontinued in 01/2016, Cosentyx 2018 for 8 months-restarted 05/11/2020: She has no synovitis or dactylitis on examination today.  She has not had any recent psoriatic arthritis flares.  Her arthralgias and joint stiffness have been better controlled with the warmer weather temperatures.  She has not required prednisone recently.  No evidence of Achilles tendinitis or plantar fasciitis.  She continues to have chronic pain and stiffness in both hands and both feet due to  underlying joint damage but no active inflammation was noted today. No active psoriasis at this time.  Her biggest concern has been the increased discomfort in her lower back.  She is under the care of Dr. Wilmon Pali and is planning on having an injection and restarting physical therapy in June.  She remains on Cosentyx 300 mg subcutaneous injections once every 28 days and Rasuvo 25 mg sq injections once weekly.  She is tolerating both medications without any side effects and has not missed any doses recently.  She was advised to notify us if she develops signs or symptoms of a flare.  She will follow-up in the office in 3 months or sooner if needed.  Psoriasis: No active psoriasis at this time.  High risk medication use - Cosentyx 300 mg sq injections once every 28 days, rasuvo 25 mg sq injections once weekly, and folic acid 2 mg daily. CBC and CMP updated on 05/30/22.  Her next lab work will be due in July and every 3 months.  Standing orders for CBC and CMP remain in place. TB gold negative 03/29/22.   No recurrent infections. Discussed the importance of holding cosentyx and rasuvo if she develops signs or symptoms of an infection and to resume once the infection has completely cleared.   Primary osteoarthritis of both hands: She has PIP and DIP thickening consistent with osteoarthritis of both hands.  Difficulty performing overuse or repetitive activities.  No active synovitis or dactylitis at this time.  Discussed the importance of joint protection and muscle strengthening.  Trigger finger, right ring finger -Ultrasound-guided cortisone injection on 06/27/2021.  Resolved.   Status post total knee replacement, bilateral - right total knee replacement November 2020 and left total knee replacement February 2021 by Dr. Christell Constant at [facility].  Doing well.  Good ROM of both knee replacements-warmth but no effusion.   Pain in both feet: Chronic pain.  Difficulty standing or walking prolonged distances.  Pes  planus noted bilaterally.  She wears oofos sandals for support.    Bilateral sacroiliitis (HCC): Tenderness over both SI joints noted today.  Most of her discomfort in her lower back seems to be due to degenerative disc disease in the lumbar spine.  She will be starting physical therapy on 07/19/2022.  DDD (degenerative disc disease), lumbar: MRI of the lumbar  spine 06/21/22: Severe degenerative disc disease.  Grade 1 retrolisthesis at several levels.  Facet joint arthropathy noted. She has had difficulty standing, sitting, and lying for prolonged periods of time due to the discomfort in her lower back. Under the  care of Dr. Wilmon Pali. Underwent fluoroscopy nerve block on 06/29/2022 which provided 95% pain relief.  She will be following up in June for an injection and will be starting PT on 07/19/22.   Other medical conditions are listed as follows:   Family history of Crohn's disease  Benign essential hypertension: BP was 119/77 today in the office.   Mixed hyperlipidemia  Prediabetes  Hiatal hernia with GERD without esophagitis  GAD (generalized anxiety disorder)  Smoker  Orders: No orders of the defined types were placed in this encounter.  No orders of the defined types were placed in this encounter.    Follow-Up Instructions: Return in about 3 months (around 10/03/2022) for Psoriatic arthritis.   Gearldine Bienenstock, PA-C  Note - This record has been created using Dragon software.  Chart creation errors have been sought, but may not always  have been located. Such creation errors do not reflect on  the standard of medical care.

## 2022-06-26 ENCOUNTER — Telehealth: Payer: Self-pay | Admitting: *Deleted

## 2022-06-26 NOTE — Telephone Encounter (Signed)
Patient advised she may come to the office to pick up a sample.  °

## 2022-06-26 NOTE — Telephone Encounter (Signed)
CBC and CMP were updated on 05/30/22.  Ok to provide sample of rasuvo

## 2022-06-26 NOTE — Telephone Encounter (Signed)
Patient contacted the office and states she is needing refills on her Cosentyx and Rasuvo. Patient advised we have sent a 90 day supply of Cosentyx to the pharmacy on 05/30/2022. Patient expressed understanding. Patient advised she is due to update labs. Patient has an appointment scheduled next week and will update them. Patient would like to know if she can have a sample of Rasuvo. Please advise.

## 2022-06-27 ENCOUNTER — Telehealth: Payer: Self-pay | Admitting: *Deleted

## 2022-06-27 ENCOUNTER — Other Ambulatory Visit: Payer: Self-pay | Admitting: *Deleted

## 2022-06-27 DIAGNOSIS — L409 Psoriasis, unspecified: Secondary | ICD-10-CM

## 2022-06-27 DIAGNOSIS — Z79899 Other long term (current) drug therapy: Secondary | ICD-10-CM

## 2022-06-27 DIAGNOSIS — L4050 Arthropathic psoriasis, unspecified: Secondary | ICD-10-CM

## 2022-06-27 MED ORDER — RASUVO 25 MG/0.5ML ~~LOC~~ SOAJ
25.0000 mg | SUBCUTANEOUS | 0 refills | Status: DC
Start: 2022-06-27 — End: 2023-04-04

## 2022-06-27 NOTE — Telephone Encounter (Signed)
Medication Samples have been provided to the patient.  Drug name: Rasuvo       Strength: 15 mg        Qty: 1   LOT: [id number]  Exp.Date: May 2024  Dosing instructions: Inject one pen into skin once.      Medication Samples have been provided to the patient.  Drug name: Rasuvo       Strength: 10 mg        Qty: 1   LOT: [id number]  Exp.Date: Sept. 2024  Dosing instructions: Inject one pen into skin once along with 15 mg pen.

## 2022-06-27 NOTE — Telephone Encounter (Signed)
Last Fill: 12/20/2021  Labs: 03/29/2022  CBC and CMP are stable.   Next Visit: 07/03/2022  Last Visit: 03/29/2022  DX: Psoriatic arthritis   Current Dose per office note 03/29/2022: rasuvo 25 mg sq injections once weekly   Okay to refill Rasuvo?

## 2022-07-03 ENCOUNTER — Ambulatory Visit: Payer: Medicare Other | Attending: Physician Assistant | Admitting: Physician Assistant

## 2022-07-03 ENCOUNTER — Encounter: Payer: Self-pay | Admitting: Physician Assistant

## 2022-07-03 VITALS — BP 119/77 | HR 66 | Resp 17 | Ht 63.0 in | Wt 212.2 lb

## 2022-07-03 DIAGNOSIS — F411 Generalized anxiety disorder: Secondary | ICD-10-CM | POA: Insufficient documentation

## 2022-07-03 DIAGNOSIS — M19042 Primary osteoarthritis, left hand: Secondary | ICD-10-CM | POA: Diagnosis present

## 2022-07-03 DIAGNOSIS — Z8379 Family history of other diseases of the digestive system: Secondary | ICD-10-CM | POA: Insufficient documentation

## 2022-07-03 DIAGNOSIS — M5136 Other intervertebral disc degeneration, lumbar region: Secondary | ICD-10-CM | POA: Insufficient documentation

## 2022-07-03 DIAGNOSIS — R7303 Prediabetes: Secondary | ICD-10-CM | POA: Insufficient documentation

## 2022-07-03 DIAGNOSIS — M79671 Pain in right foot: Secondary | ICD-10-CM | POA: Insufficient documentation

## 2022-07-03 DIAGNOSIS — E782 Mixed hyperlipidemia: Secondary | ICD-10-CM | POA: Diagnosis present

## 2022-07-03 DIAGNOSIS — M19041 Primary osteoarthritis, right hand: Secondary | ICD-10-CM | POA: Diagnosis present

## 2022-07-03 DIAGNOSIS — L4050 Arthropathic psoriasis, unspecified: Secondary | ICD-10-CM | POA: Insufficient documentation

## 2022-07-03 DIAGNOSIS — M461 Sacroiliitis, not elsewhere classified: Secondary | ICD-10-CM | POA: Diagnosis present

## 2022-07-03 DIAGNOSIS — Z79899 Other long term (current) drug therapy: Secondary | ICD-10-CM | POA: Insufficient documentation

## 2022-07-03 DIAGNOSIS — F17200 Nicotine dependence, unspecified, uncomplicated: Secondary | ICD-10-CM | POA: Insufficient documentation

## 2022-07-03 DIAGNOSIS — M65341 Trigger finger, right ring finger: Secondary | ICD-10-CM | POA: Insufficient documentation

## 2022-07-03 DIAGNOSIS — M79672 Pain in left foot: Secondary | ICD-10-CM | POA: Insufficient documentation

## 2022-07-03 DIAGNOSIS — K449 Diaphragmatic hernia without obstruction or gangrene: Secondary | ICD-10-CM | POA: Diagnosis present

## 2022-07-03 DIAGNOSIS — K219 Gastro-esophageal reflux disease without esophagitis: Secondary | ICD-10-CM | POA: Insufficient documentation

## 2022-07-03 DIAGNOSIS — I10 Essential (primary) hypertension: Secondary | ICD-10-CM | POA: Diagnosis present

## 2022-07-03 DIAGNOSIS — L409 Psoriasis, unspecified: Secondary | ICD-10-CM | POA: Diagnosis present

## 2022-07-03 DIAGNOSIS — Z96653 Presence of artificial knee joint, bilateral: Secondary | ICD-10-CM | POA: Insufficient documentation

## 2022-07-03 NOTE — Patient Instructions (Signed)
Standing Labs We placed an order today for your standing lab work.   Please have your standing labs drawn in July and every 3 months   Please have your labs drawn 2 weeks prior to your appointment so that the provider can discuss your lab results at your appointment, if possible.  Please note that you may see your imaging and lab results in MyChart before we have reviewed them. We will contact you once all results are reviewed. Please allow our office up to 72 hours to thoroughly review all of the results before contacting the office for clarification of your results.  WALK-IN LAB HOURS  Monday through Thursday from 8:00 am -12:30 pm and 1:00 pm-5:00 pm and Friday from 8:00 am-12:00 pm.  Patients with office visits requiring labs will be seen before walk-in labs.  You may encounter longer than normal wait times. Please allow additional time. Wait times may be shorter on  Monday and Thursday afternoons.  We do not book appointments for walk-in labs. We appreciate your patience and understanding with our staff.   Labs are drawn by [facility]. Please bring your co-pay at the time of your lab draw.  You may receive a bill from [facility] for your lab work.  Please note if you are on Hydroxychloroquine and and an order has been placed for a Hydroxychloroquine level,  you will need to have it drawn 4 hours or more after your last dose.  If you wish to have your labs drawn at another location, please call the office 24 hours in advance so we can fax the orders.  The office is located at [address], [address], [place], [place] [address]   If you have any questions regarding directions or hours of operation,  please call [phone].   As a reminder, please drink plenty of water prior to coming for your lab work. Thanks!  

## 2022-09-19 NOTE — Progress Notes (Unsigned)
Office Visit Note  Patient: Sherry Stein             Date of Birth: 1970-03-10           MRN: [id number]             PCP: Barbarann Ehlers Referring: Jordan Hawks, PA-C Visit Date: 10/03/2022 Occupation: @GUAROCC @  Subjective:  Right shoulder pain  History of Present Illness: Sherry Stein is a 52 y.o. female with history of psoriatic arthritis and osteoarthritis.  Patient remains on Cosentyx 300 mg sq injections once every 28 days, rasuvo 25 mg sq injections once weekly, and folic acid 2 mg daily.  Patient presents today with right shoulder joint pain which started about 2 weeks ago.  No injury prior to the onset of symptoms.  Patient states that she did have a gap in therapy in June while she took a course of antibiotics for a paronychia of the left ring finger.  Patient states that she has a few small scattered patches of psoriasis on her lower legs.  She states her lower back pain has improved since undergoing ablation on 07/30/2022.  She denies any achilles tendonitis or plantar fasciitis.  Patient states that she was walking on concrete [place] way [place] the beach in [place] and ended up injuring her right great toe.  Patient had x-rays when she returned from the beach and which revealed a fracture.  She was evaluated by Dr. Lelon Mast and did not require surgical intervention.  According to the patient the fracture healed within 4 to 5 weeks. She denies any residual pain at this time.    Activities of Daily Living:  Patient reports morning stiffness for 2-3 hours.   Patient Denies nocturnal pain.  Difficulty dressing/grooming: Denies Difficulty climbing stairs: Denies Difficulty getting out of chair: Denies Difficulty using hands for taps, buttons, cutlery, and/or writing: Reports  Review of Systems  Constitutional:  Positive for fatigue.  HENT:  Negative for mouth sores and mouth dryness.   Eyes:  Negative for dryness.  Respiratory:  Negative for shortness of  breath.   Cardiovascular:  Negative for chest pain and palpitations.  Gastrointestinal:  Positive for diarrhea. Negative for blood in stool and constipation.  Endocrine: Negative for increased urination.  Genitourinary:  Negative for involuntary urination.  Musculoskeletal:  Positive for joint pain, joint pain, joint swelling and morning stiffness. Negative for gait problem, myalgias, muscle weakness, muscle tenderness and myalgias.  Skin:  Negative for color change, rash, hair loss and sensitivity to sunlight.  Allergic/Immunologic: Negative for susceptible to infections.  Neurological:  Negative for dizziness and headaches.  Hematological:  Negative for swollen glands.  Psychiatric/Behavioral:  Negative for depressed mood and sleep disturbance. The patient is not nervous/anxious.     PMFS History:  Patient Active Problem List   Diagnosis Date Noted   Hyperlipidemia 08/10/2016   GAD (generalized anxiety disorder) 08/04/2016   History of autoimmune disorder 03/20/2016   Open leg wound, right, sequela 03/20/2016   Drug therapy 05/10/2015   Acute frontal sinusitis 05/09/2015   Allergic conjunctivitis 05/09/2015   Amenorrhea 05/09/2015   Benign essential hypertension 05/09/2015   Depression 05/09/2015   Foot arch pain 05/09/2015   Edema 05/09/2015   Hiatal hernia with GERD without esophagitis 05/09/2015   Muscle spasm 05/09/2015   Pain in joint involving multiple sites 05/09/2015   Psoriasis 05/09/2015   Menorrhagia with irregular cycle 04/10/2015   Hammer toe of right foot 04/07/2015  Pain from implanted hardware 04/07/2015    Past Medical History:  Diagnosis Date   Arthritis    Diabetes mellitus without complication (HCC)    Hypertension     Family History  Problem Relation Age of Onset   Hypertension Mother    Diabetes Father    COPD Father    Hypertension Father    Cancer Sister    High Cholesterol Brother    Crohn's disease Son    Past Surgical History:   Procedure Laterality Date   BUNIONECTOMY Bilateral 2007   CARPAL TUNNEL RELEASE Right 07/2019   REPLACEMENT TOTAL KNEE Left 03/2019   REPLACEMENT TOTAL KNEE Right 12/2018   TOE SURGERY Right    great toe x3   TONSILLECTOMY AND ADENOIDECTOMY     TYMPANOSTOMY TUBE PLACEMENT     WISDOM TOOTH EXTRACTION     20s   Social History   Social History Narrative   ** Merged History Encounter **       Immunization History  Administered Date(s) Administered   COVID-19, mRNA, vaccine(Comirnaty)12 years and older 11/28/2021   PFIZER(Purple Top)SARS-COV-2 Vaccination 11/24/2019, 12/15/2019   Pfizer Covid-19 Vaccine Bivalent Booster 27yrs & up 03/01/2021     Objective: Vital Signs: BP 128/89 (BP Location: Right Arm, Patient Position: Sitting, Cuff Size: Large)   Pulse 64   Resp 17   Ht 5\' 4"  (1.626 m)   Wt 214 lb 12.8 oz (97.4 kg)   BMI 36.87 kg/m    Physical Exam Vitals and nursing note reviewed.  Constitutional:      Appearance: She is well-developed.  HENT:     Head: Normocephalic and atraumatic.  Eyes:     Conjunctiva/sclera: Conjunctivae normal.  Cardiovascular:     Rate and Rhythm: Normal rate and regular rhythm.     Heart sounds: Normal heart sounds.  Pulmonary:     Effort: Pulmonary effort is normal.     Breath sounds: Normal breath sounds.  Abdominal:     General: Bowel sounds are normal.     Palpations: Abdomen is soft.  Musculoskeletal:     Cervical back: Normal range of motion.  Lymphadenopathy:     Cervical: No cervical adenopathy.  Skin:    General: Skin is warm and dry.     Capillary Refill: Capillary refill takes less than 2 seconds.  Neurological:     Mental Status: She is alert and oriented to person, place, and time.  Psychiatric:        Behavior: Behavior normal.      Musculoskeletal Exam: C-spine has good range of motion.  Limited mobility of the lumbar spine.  No SI joint tenderness upon palpation.  Left shoulder has full range of motion with no  discomfort.  Pain with crossover of the right shoulder consistent with impingement.  Full abduction of the right shoulder.  Elbow joints, wrist joints, MCPs, PIPs, DIPs have good range of motion with no synovitis.  PIP and DIP thickening noted.  Thickening over MCP joints but no active synovitis or dactylitis noted.  Hip joints have good range of motion with no groin pain.  Both knee replacements have good range of motion no warmth or effusion.  Ankle joints have good range of motion with no joint tenderness or synovitis.  No evidence of Achilles tendinitis or plantar fasciitis.forefoot deformity.  PIP and DIP thickening.    CDAI Exam: CDAI Score: -- Patient Global: --; Provider Global: -- Swollen: --; Tender: -- Joint Exam 10/03/2022   No joint  exam has been documented for this visit   There is currently no information documented on the homunculus. Go to the Rheumatology activity and complete the homunculus joint exam.  Investigation: No additional findings.  Imaging: XR Shoulder Right  Result Date: 10/03/2022 No glenohumeral or acromioclavicular joint space narrowing was noted.  No chondrocalcinosis was noted. Impression: Unremarkable x-rays of the shoulder.   Recent Labs: Lab Results  Component Value Date   WBC 8.8 03/29/2022   HGB 15.6 (H) 03/29/2022   PLT 257 03/29/2022   NA 140 03/29/2022   K 4.2 03/29/2022   CL 104 03/29/2022   CO2 26 03/29/2022   GLUCOSE 97 03/29/2022   BUN 17 03/29/2022   CREATININE 0.67 03/29/2022   BILITOT 0.4 03/29/2022   AST 16 03/29/2022   ALT 19 03/29/2022   PROT 6.9 03/29/2022   CALCIUM 9.4 03/29/2022   GFRAA 105 06/13/2020   QFTBGOLDPLUS NEGATIVE 03/29/2022    Speciality Comments: MTX since2004, Humira-2004-2017-quit working, cosyntex 2018x 8 months, restarted May 11, 2020   FU every 3 months  Procedures:  Large Joint Inj: R glenohumeral on 10/03/2022 2:07 PM Indications: pain Details: 27 G 1.5 in needle, posterior  approach  Arthrogram: No  Medications: 1.5 mL lidocaine 1 %; 40 mg triamcinolone acetonide 40 MG/ML Aspirate: 0 mL Outcome: tolerated well, no immediate complications Procedure, treatment alternatives, risks and benefits explained, specific risks discussed. Consent was given by the patient. Immediately prior to procedure a time out was called to verify the correct patient, procedure, equipment, support staff and site/side marked as required. Patient was prepped and draped in the usual sterile fashion.     Allergies: Patient has no known allergies.   Assessment / Plan:     Visit Diagnoses: Psoriatic arthritis (HCC) - Dxd in 2004 by Dr. Coral Spikes.  She has been on methotrexate since 2004.  Humira discontinued in 01/2016, Cosentyx 2018 for 8 months-restarted 05/11/2020: No synovitis or dactylitis noted on examination today.  Patient remains on Cosentyx 300 mg subcutaneous injections once monthly and Rasuvo 25 mg subcu injections once weekly.  She is tolerating combination therapy without any side effects or injection site reactions.  Patient had a small interruption with taking her Rasuvo in June while taking antibiotics for a paronychia but otherwise has not had any gaps in therapy since her last office visit. Patient presents today with pain in the right shoulder consistent with impingement syndrome.  Patient requested a cortisone injection today.  X-rays of the right shoulder obtained today and the right glenohumeral joint was injected with cortisone.  She tolerated procedure well.  Procedure note was completed above.  Aftercare was discussed.  Patient was advised to notify us if her symptoms persist or worsen. Overall her psoriatic arthritis and psoriasis remain well-controlled on the current treatment regimen.  No medication changes will be made at this time.   No SI joint tenderness.  No evidence of achilles tendonitis or plantar fasciitis.   She will follow-up in the office in 3 months or sooner if  needed.  Psoriasis: She has a few small scattered patches of psoriasis on her lower extremities.  Patient declined a prescription for a topical agent at this time.  She will remain on Cosentyx and Rasuvo as prescribed.  High risk medication use - Cosentyx 300 mg sq injections once every 28 days, rasuvo 25 mg sq injections once weekly, and folic acid 2 mg daily.  TB gold negative on 03/29/22.   CBC updated on 05/30/22. Orders for  CBC and CMP released today.  Her next lab work will be due in November and every 3 months to monitor for drug toxicity. Discussed the importance of holding cosentyx and rasuvo if she develops signs or symptoms of an infection and to resume once the infection has completely cleared.  - Plan: CBC with Differential/Platelet, COMPLETE METABOLIC PANEL WITH GFR  Chronic right shoulder pain -Patient presents today with right shoulder pain x 2 weeks.  No recent injury or fall.  Her pain is most severe with crossover test.  She has full internal rotation and abduction of the right shoulder.  X-rays of the right shoulder obtained today for further evaluation.  After informed consent the right glenohumeral joint was injected with cortisone.  She tolerated procedure well.  Procedure note was completed above.  Aftercare was discussed.  She was advised to notify us if her symptoms persist or worsen.  Plan: XR Shoulder Right, Large Joint Inj: R glenohumeral  Primary osteoarthritis of both hands: PIP and DIP thickening consistent with  osteoarthritis and PsA overlap. No active synovitis or dactylitis noted.   Trigger finger, right ring finger: Not currently symptomatic.   Status post total knee replacement, bilateral: Doing well.  Good range of motion of both knee replacements with no warmth or effusion  Pain in both feet: Chronic pain. S/p previous surgeries.  Recently evaluated by Dr. Lelon Mast on 07/31/22 for management of a displaced fracture of the distal phalanx of the right great  toe--injury on concrete.  Healed in 4-5 weeks per patient.    Bilateral sacroiliitis [facility]): No SI joint tenderness upon palpation today.  DDD (degenerative disc disease), lumbar - MRI of the lumbar spine 06/21/22: Severe degenerative disc disease.  Grade 1 retrolisthesis at several levels.  Facet joint arthropathy noted. Under the care of Dr. Wilmon Pali.  Patient underwent an ablation on 07/30/2022 she has provided significant relief.  Other medical conditions are listed as follows:  Family history of Crohn's disease  Benign essential hypertension: Blood pressure was 128/89 today in the office.  Mixed hyperlipidemia  Prediabetes  GAD (generalized anxiety disorder)  Smoker   Orders: Orders Placed This Encounter  Procedures   Large Joint Inj: R glenohumeral   XR Shoulder Right   CBC with Differential/Platelet   COMPLETE METABOLIC PANEL WITH GFR   No orders of the defined types were placed in this encounter.   Follow-Up Instructions: Return in about 3 months (around 01/03/2023) for Psoriatic arthritis.   Gearldine Bienenstock, PA-C  Note - This record has been created using Dragon software.  Chart creation errors have been sought, but may not always  have been located. Such creation errors do not reflect on  the standard of medical care.

## 2022-10-03 ENCOUNTER — Ambulatory Visit (INDEPENDENT_AMBULATORY_CARE_PROVIDER_SITE_OTHER): Payer: Medicare Other

## 2022-10-03 ENCOUNTER — Encounter: Payer: Self-pay | Admitting: Physician Assistant

## 2022-10-03 ENCOUNTER — Ambulatory Visit: Payer: Medicare Other | Attending: Physician Assistant | Admitting: Physician Assistant

## 2022-10-03 VITALS — BP 128/89 | HR 64 | Resp 17 | Ht 64.0 in | Wt 214.8 lb

## 2022-10-03 DIAGNOSIS — I10 Essential (primary) hypertension: Secondary | ICD-10-CM | POA: Diagnosis present

## 2022-10-03 DIAGNOSIS — K219 Gastro-esophageal reflux disease without esophagitis: Secondary | ICD-10-CM

## 2022-10-03 DIAGNOSIS — M19042 Primary osteoarthritis, left hand: Secondary | ICD-10-CM | POA: Diagnosis present

## 2022-10-03 DIAGNOSIS — Z8379 Family history of other diseases of the digestive system: Secondary | ICD-10-CM

## 2022-10-03 DIAGNOSIS — M79671 Pain in right foot: Secondary | ICD-10-CM

## 2022-10-03 DIAGNOSIS — M5136 Other intervertebral disc degeneration, lumbar region: Secondary | ICD-10-CM

## 2022-10-03 DIAGNOSIS — R7303 Prediabetes: Secondary | ICD-10-CM

## 2022-10-03 DIAGNOSIS — M65341 Trigger finger, right ring finger: Secondary | ICD-10-CM

## 2022-10-03 DIAGNOSIS — M19041 Primary osteoarthritis, right hand: Secondary | ICD-10-CM | POA: Diagnosis present

## 2022-10-03 DIAGNOSIS — F17200 Nicotine dependence, unspecified, uncomplicated: Secondary | ICD-10-CM

## 2022-10-03 DIAGNOSIS — Z79899 Other long term (current) drug therapy: Secondary | ICD-10-CM

## 2022-10-03 DIAGNOSIS — Z96653 Presence of artificial knee joint, bilateral: Secondary | ICD-10-CM

## 2022-10-03 DIAGNOSIS — M25511 Pain in right shoulder: Secondary | ICD-10-CM | POA: Diagnosis not present

## 2022-10-03 DIAGNOSIS — G8929 Other chronic pain: Secondary | ICD-10-CM

## 2022-10-03 DIAGNOSIS — L409 Psoriasis, unspecified: Secondary | ICD-10-CM

## 2022-10-03 DIAGNOSIS — E782 Mixed hyperlipidemia: Secondary | ICD-10-CM

## 2022-10-03 DIAGNOSIS — F411 Generalized anxiety disorder: Secondary | ICD-10-CM | POA: Diagnosis present

## 2022-10-03 DIAGNOSIS — M461 Sacroiliitis, not elsewhere classified: Secondary | ICD-10-CM | POA: Diagnosis present

## 2022-10-03 DIAGNOSIS — L4050 Arthropathic psoriasis, unspecified: Secondary | ICD-10-CM

## 2022-10-03 DIAGNOSIS — M79672 Pain in left foot: Secondary | ICD-10-CM | POA: Diagnosis present

## 2022-10-03 MED ORDER — TRIAMCINOLONE ACETONIDE 40 MG/ML IJ SUSP
40.0000 mg | INTRAMUSCULAR | Status: AC | PRN
Start: 2022-10-03 — End: 2022-10-03
  Administered 2022-10-03: 40 mg via INTRA_ARTICULAR

## 2022-10-03 MED ORDER — LIDOCAINE HCL 1 % IJ SOLN
1.5000 mL | INTRAMUSCULAR | Status: AC | PRN
Start: 2022-10-03 — End: 2022-10-03
  Administered 2022-10-03: 1.5 mL

## 2022-10-03 NOTE — Patient Instructions (Signed)

## 2022-10-04 LAB — COMPLETE METABOLIC PANEL WITH GFR
AG Ratio: 2 (calc) (ref 1.0–2.5)
ALT: 25 U/L (ref 6–29)
AST: 20 U/L (ref 10–35)
Albumin: 4.6 g/dL (ref 3.6–5.1)
Alkaline phosphatase (APISO): 66 U/L (ref 37–153)
BUN: 19 mg/dL (ref 7–25)
CO2: 28 mmol/L (ref 20–32)
Calcium: 9.4 mg/dL (ref 8.6–10.4)
Chloride: 104 mmol/L (ref 98–110)
Creat: 0.76 mg/dL (ref 0.50–1.03)
Globulin: 2.3 g/dL (ref 1.9–3.7)
Glucose, Bld: 98 mg/dL (ref 65–99)
Potassium: 4.3 mmol/L (ref 3.5–5.3)
Sodium: 139 mmol/L (ref 135–146)
Total Bilirubin: 0.5 mg/dL (ref 0.2–1.2)
Total Protein: 6.9 g/dL (ref 6.1–8.1)
eGFR: 94 mL/min/{1.73_m2} (ref >=60)

## 2022-10-04 LAB — CBC WITH DIFFERENTIAL/PLATELET
Absolute Monocytes: 605 {cells}/uL (ref 200–950)
Basophils Absolute: 50 {cells}/uL (ref 0–200)
Basophils Relative: 0.6 %
Eosinophils Absolute: 344 {cells}/uL (ref 15–500)
Eosinophils Relative: 4.1 %
HCT: 46.1 % — ABNORMAL HIGH (ref 35.0–45.0)
Hemoglobin: 15.8 g/dL — ABNORMAL HIGH (ref 11.7–15.5)
Lymphs Abs: 2495 {cells}/uL (ref 850–3900)
MCH: 33.1 pg — ABNORMAL HIGH (ref 27.0–33.0)
MCHC: 34.3 g/dL (ref 32.0–36.0)
MCV: 96.6 fL (ref 80.0–100.0)
MPV: 9.9 fL (ref 7.5–12.5)
Monocytes Relative: 7.2 %
Neutro Abs: 4906 {cells}/uL (ref 1500–7800)
Neutrophils Relative %: 58.4 %
Platelets: 280 10*3/uL (ref 140–400)
RBC: 4.77 10*6/uL (ref 3.80–5.10)
RDW: 12.5 % (ref 11.0–15.0)
Total Lymphocyte: 29.7 %
WBC: 8.4 10*3/uL (ref 3.8–10.8)

## 2022-10-04 NOTE — Progress Notes (Signed)
CMP WNL Hgb and hct are borderline elevated but stable.

## 2022-10-18 ENCOUNTER — Telehealth: Payer: Self-pay | Admitting: *Deleted

## 2022-10-18 NOTE — Telephone Encounter (Signed)
LMOM, CD at front desk for patient to pick up at convenience.

## 2022-10-18 NOTE — Telephone Encounter (Signed)
Patient contacted the office stating she has an appointment with orthopedic for evaluation of her shoulder on 10/25/2022. Patient would like a disc with her shoulder x-rays on it to take to her appointment.

## 2022-10-25 NOTE — Telephone Encounter (Signed)
Discussed with Dr. Nicholes Mango will be unable to provide a letter but can provide supporting documents including her medical records.  If she would like she can schedule a sooner office visit and we can document her limitations in order to submit these records on her behalf.

## 2022-11-06 ENCOUNTER — Other Ambulatory Visit: Payer: Self-pay | Admitting: *Deleted

## 2022-11-06 DIAGNOSIS — L409 Psoriasis, unspecified: Secondary | ICD-10-CM

## 2022-11-06 DIAGNOSIS — L4050 Arthropathic psoriasis, unspecified: Secondary | ICD-10-CM

## 2022-11-06 DIAGNOSIS — Z79899 Other long term (current) drug therapy: Secondary | ICD-10-CM

## 2022-11-06 MED ORDER — COSENTYX SENSOREADY (300 MG) 150 MG/ML ~~LOC~~ SOAJ
300.0000 mg | SUBCUTANEOUS | 0 refills | Status: DC
Start: 2022-11-06 — End: 2023-08-22

## 2022-11-06 NOTE — Telephone Encounter (Signed)
Last Fill: 05/30/2022  Labs: 10/03/2022 CMP WNL Hgb and hct are borderline elevated but stable.  TB Gold: 03/29/2022 Neg    Next Visit: 01/02/2023  Last Visit: 10/03/2022  [id number] arthritis   Current Dose per office note 10/03/2022: Cosentyx 300 mg sq injections once every 28 days   Okay to refill Cosentyx?

## 2022-11-06 NOTE — Telephone Encounter (Signed)
Medication Samples have been provided to the patient.  Drug name: Cosentyx       Strength: 300 mg        Qty: 1  LOT: [id number]  Exp.Date: 06/2023  Dosing instructions: Inject 300 mg into skin every 28 days.

## 2022-11-19 ENCOUNTER — Telehealth: Payer: Self-pay | Admitting: Pharmacist

## 2022-11-19 NOTE — Telephone Encounter (Signed)
Received PAP renewal application from [employer] for patient's Cosentyx SQ. Mailed to pt's home today.   Provider portion will be completed after patient portion is returned. Patient has no rx coverage as of today   Chesley Mires, PharmD, MPH, BCPS, CPP Clinical Pharmacist (Rheumatology and Pulmonology)

## 2022-11-30 ENCOUNTER — Other Ambulatory Visit: Payer: Self-pay | Admitting: *Deleted

## 2022-11-30 MED ORDER — FOLIC ACID 1 MG PO TABS: 2.0000 mg | ORAL_TABLET | Freq: Every day | ORAL | 3 refills | Status: DC

## 2022-11-30 NOTE — Telephone Encounter (Signed)
Refill request received via fax from [facility]  for Folic Acid    Last Fill: 11/16/2021  Next Visit: 01/02/2023  Last Visit: 09/29/2022  Dx: Psoriatic arthritis   Current Dose per office note on 10/03/2022: folic acid 2 mg daily.   Okay to refill Folic Acid?

## 2022-12-19 NOTE — Progress Notes (Unsigned)
Office Visit Note  Patient: Sherry Stein             Date of Birth: 04/13/1970           MRN: [id number]             PCP: Barbarann Ehlers Referring: Jordan Hawks, PA-C Visit Date: 01/02/2023 Occupation: @GUAROCC @  Subjective:  Right thumb pain and swelling   History of Present Illness: Sherry Stein is a 52 y.o. female with history of psoriatic arthritis and osteoarthritis. Patient remains on  Cosentyx 300 mg sq injections once every 28 days, rasuvo 25 mg sq injections once weekly, and folic acid 2 mg daily.  She is tolerating combination therapy without any side effects or injection site reactions.  She has not had any interruptions in therapy recently.  She denies any recent or recurrent infections.  Patient states she is currently having a flare involving the left thumb.  Patient states that she has been crocheting recently which likely exacerbated her symptoms this past weekend.  She has also noticed increased arthralgias with the weather changes.  She has been having increased discomfort in her lower back and is scheduled to follow-up with her back specialist in December 2024.  She had an ablation performed in June 2024 which provided significant benefit but her symptoms have started to return.  Patient states that she has had improvement in her right shoulder joint pain since undergoing a cortisone injection by orthopedics.  Patient continues to have stiffness lasting several hours every morning.  She experiences intermittent discomfort in both feet and has to wear oofos for support.  She denies any psoriasis at this time.     Activities of Daily Living:  Patient reports morning stiffness for 2 hours.   Patient Denies nocturnal pain.  Difficulty dressing/grooming: Denies Difficulty climbing stairs: Denies Difficulty getting out of chair: Denies Difficulty using hands for taps, buttons, cutlery, and/or writing: Reports  Review of Systems  Constitutional:   Positive for fatigue.  HENT:  Negative for mouth sores and mouth dryness.   Eyes:  Negative for dryness.  Respiratory:  Negative for shortness of breath.   Cardiovascular:  Negative for chest pain and palpitations.  Gastrointestinal:  Negative for blood in stool, constipation and diarrhea.  Endocrine: Negative for increased urination.  Genitourinary:  Negative for involuntary urination.  Musculoskeletal:  Positive for joint pain, joint pain, joint swelling, myalgias, morning stiffness, muscle tenderness and myalgias. Negative for gait problem and muscle weakness.  Skin:  Negative for color change, rash, hair loss and sensitivity to sunlight.  Allergic/Immunologic: Negative for susceptible to infections.  Neurological:  Negative for dizziness and headaches.  Hematological:  Negative for swollen glands.  Psychiatric/Behavioral:  Positive for depressed mood. Negative for sleep disturbance. The patient is nervous/anxious.     PMFS History:  Patient Active Problem List   Diagnosis Date Noted   Hyperlipidemia 08/10/2016   GAD (generalized anxiety disorder) 08/04/2016   History of autoimmune disorder 03/20/2016   Open leg wound, right, sequela 03/20/2016   Drug therapy 05/10/2015   Acute frontal sinusitis 05/09/2015   Allergic conjunctivitis 05/09/2015   Amenorrhea 05/09/2015   Benign essential hypertension 05/09/2015   Depression 05/09/2015   Foot arch pain 05/09/2015   Edema 05/09/2015   Hiatal hernia with GERD without esophagitis 05/09/2015   Muscle spasm 05/09/2015   Pain in joint involving multiple sites 05/09/2015   Psoriasis 05/09/2015   Menorrhagia with irregular cycle 04/10/2015  Hammer toe of right foot 04/07/2015   Pain from implanted hardware 04/07/2015    Past Medical History:  Diagnosis Date   Arthritis    Diabetes mellitus without complication (HCC)    Hypertension     Family History  Problem Relation Age of Onset   Hypertension Mother    Diabetes Father     COPD Father    Hypertension Father    Cancer Sister    High Cholesterol Brother    Crohn's disease Son    Past Surgical History:  Procedure Laterality Date   BUNIONECTOMY Bilateral 2007   CARPAL TUNNEL RELEASE Right 07/2019   REPLACEMENT TOTAL KNEE Left 03/2019   REPLACEMENT TOTAL KNEE Right 12/2018   TOE SURGERY Right    great toe x3   TONSILLECTOMY AND ADENOIDECTOMY     TYMPANOSTOMY TUBE PLACEMENT     WISDOM TOOTH EXTRACTION     20s   Social History   Social History Narrative   ** Merged History Encounter **       Immunization History  Administered Date(s) Administered   PFIZER(Purple Top)SARS-COV-2 Vaccination 11/24/2019, 12/15/2019   Pfizer Covid-19 Vaccine Bivalent Booster 56yrs & up 03/01/2021   Pfizer(Comirnaty)Fall Seasonal Vaccine 12 years and older 11/28/2021     Objective: Vital Signs: BP 138/89 (BP Location: Left Arm, Patient Position: Sitting, Cuff Size: Normal)   Pulse 65   Resp 15   Ht 5\' 4"  (1.626 m)   Wt 218 lb (98.9 kg)   BMI 37.42 kg/m    Physical Exam Vitals and nursing note reviewed.  Constitutional:      Appearance: She is well-developed.  HENT:     Head: Normocephalic and atraumatic.  Eyes:     Conjunctiva/sclera: Conjunctivae normal.  Cardiovascular:     Rate and Rhythm: Normal rate and regular rhythm.     Heart sounds: Normal heart sounds.  Pulmonary:     Effort: Pulmonary effort is normal.     Breath sounds: Normal breath sounds.  Abdominal:     General: Bowel sounds are normal.     Palpations: Abdomen is soft.  Musculoskeletal:     Cervical back: Normal range of motion.  Lymphadenopathy:     Cervical: No cervical adenopathy.  Skin:    General: Skin is warm and dry.     Capillary Refill: Capillary refill takes less than 2 seconds.  Neurological:     Mental Status: She is alert and oriented to person, place, and time.  Psychiatric:        Behavior: Behavior normal.      Musculoskeletal Exam: C-spine has good ROM. Limited  mobility of the lumbar spine.  No SI joint tenderness.  Shoulder joints have good ROM.  Elbow joint and wrist joints have good ROM. Tenderness and synovitis of the right 1st MCP joint.  MCP, PIP, and DIP thickening noted.  Hip joints have good ROM with no groin pain.  Bilateral knee replacements have good ROM.  forefoot deformity noted. PIP and DIP thickening.  No evidence of achilles tendonitis or plantar fasciitis.    CDAI Exam: CDAI Score: -- Patient Global: --; Provider Global: -- Swollen: --; Tender: -- Joint Exam 01/02/2023   No joint exam has been documented for this visit   There is currently no information documented on the homunculus. Go to the Rheumatology activity and complete the homunculus joint exam.  Investigation: No additional findings.  Imaging: No results found.  Recent Labs: Lab Results  Component Value Date  WBC 8.4 10/03/2022   HGB 15.8 (H) 10/03/2022   PLT 280 10/03/2022   NA 139 10/03/2022   K 4.3 10/03/2022   CL 104 10/03/2022   CO2 28 10/03/2022   GLUCOSE 98 10/03/2022   BUN 19 10/03/2022   CREATININE 0.76 10/03/2022   BILITOT 0.5 10/03/2022   AST 20 10/03/2022   ALT 25 10/03/2022   PROT 6.9 10/03/2022   CALCIUM 9.4 10/03/2022   GFRAA 105 06/13/2020   QFTBGOLDPLUS NEGATIVE 03/29/2022    Speciality Comments: MTX since2004, Humira-2004-2017-quit working, cosyntex 2018x 8 months, restarted May 11, 2020   FU every 3 months  Procedures:  No procedures performed Allergies: Patient has no known allergies.   Assessment / Plan:     Visit Diagnoses: Psoriatic arthritis (HCC) - Dxd in 2004 by Dr. Coral Spikes.  She has been on methotrexate since 2004.  Humira discontinued in 01/2016, Cosentyx 2018 for 8 months-restarted 05/11/2020: Patient presents today experiencing a flare involving her right thumb.  She has tenderness and synovitis over the right first MCP which is likely due to repetitive activity while crocheting this past weekend.  Patient continues  to have morning stiffness lasting several hours and has started to have an increased discomfort in her lower back.  She had ablation performed in June 2024 but her symptoms have started to recur.  She continues to experience intermittent discomfort in both feet due to previous joint damage.  No active inflammation was noted today.  No evidence of Achilles tendinitis or plantar fasciitis.  She wears oofos sandals for support.  No active psoriasis at this time.  Overall her inflammation has been well-controlled on the current treatment regimen. She remains on Cosentyx 300 mg subcutaneous injections every 28 days, Rasuvo 25 mg sq injections once weekly, and folic acid 2 mg daily.  She has not had any recent gaps in therapy.  No recent or recurrent infections.  No medication changes will be made at this time.  No medication changes will be made at this time.  She was advised to notify us if she starts to have more recurrent flares.  She will follow-up in the office in 3 months or sooner if needed.  Psoriasis: No active psoriasis at this time.  No medication changes recommended at this time.  High risk medication use - Cosentyx 300 mg sq injections once every 28 days, rasuvo 25 mg sq injections once weekly, and folic acid 2 mg daily.  CBC updated on 12/03/22. CMP updated on 10/03/22. CMP updated today.  Her next lab work will be due in February and every 3 months. Standing orders for CBC and CMP placed today.  TB gold negative on 03/29/22. Future order placed today.   No recent or recurrent infections. Discussed the importance of holding cosentyx and rasuvo if she develops signs or symptoms of an infection and to resume once the infection has completely cleared.  - Plan: COMPLETE METABOLIC PANEL WITH GFR, CBC with Differential/Platelet, COMPLETE METABOLIC PANEL WITH GFR, QuantiFERON-TB Gold Plus  Screening for tuberculosis -Future order for TB gold placed today.  Plan: QuantiFERON-TB Gold Plus  Primary  osteoarthritis of both hands: Patient presents today experiencing a flare involving her right thumb.  She has tenderness and synovitis of the right first MCP joint.  She has been crocheting recently which has likely exacerbated her symptoms.  She continues to have stiffness lasting several hours every morning but finds that crocheting helps with her stiffness and preserving her strength.  Patient requested a  prednisone taper which was sent to the pharmacy today.  If her symptoms persist or worsen we can schedule an ultrasound-guided right first MCP joint cortisone injection in the future if needed.  Trigger finger, right ring finger: Not currently symptomatic.   Status post total knee replacement, bilateral: Doing well.  Good ROM with no discomfort currently.   Pain in both feet - Chronic pain. S/p previous surgeries. Dr. Lelon Mast 07/31/22 for management of a displaced fracture of the distal phalanx of the right great toe--injury on concrete.  Patient continues to have chronic pain in both feet due to underlying arthritis and previous joint damage.  No active inflammation noted on examination today.  No evidence of Achilles tendinitis or plantar fasciitis.  She has difficulty standing or walking for long distances due to the discomfort in her feet.  Bilateral sacroiliitis (HCC): Chronic pain-stable.   Degeneration of intervertebral disc of lumbar region without discogenic back pain or lower extremity pain - MRI of the lumbar spine 06/21/22: Severe DDD.Grade 1 retrolisthesis.Facet arthropathy.Dr. Wilmon Pali.  Patient underwent ablation on 07/30/2022--her symptoms have started to recur.  She has difficulty standing or sitting for prolonged periods of time.  Upcoming appointment scheduled in December 2024.   Other medical conditions are listed as follows:   Family history of Crohn's disease  Benign essential hypertension: BP was 138/89 today in the office.   Mixed hyperlipidemia  Prediabetes  GAD  (generalized anxiety disorder)  Smoker   Orders: Orders Placed This Encounter  Procedures   COMPLETE METABOLIC PANEL WITH GFR   CBC with Differential/Platelet   COMPLETE METABOLIC PANEL WITH GFR   QuantiFERON-TB Gold Plus   Meds ordered this encounter  Medications   predniSONE (DELTASONE) 5 MG tablet    Sig: Take 4 tabs po x 4 days, 3  tabs po x 4 days, 2  tabs po x 4 days, 1  tab po x 4 days    Dispense:  40 tablet    Refill:  0     Follow-Up Instructions: Return in about 3 months (around 04/04/2023) for Psoriatic arthritis, Osteoarthritis.   Gearldine Bienenstock, PA-C  Note - This record has been created using Dragon software.  Chart creation errors have been sought, but may not always  have been located. Such creation errors do not reflect on  the standard of medical care.

## 2023-01-02 ENCOUNTER — Ambulatory Visit: Payer: Medicare Other | Attending: Physician Assistant | Admitting: Physician Assistant

## 2023-01-02 ENCOUNTER — Encounter: Payer: Self-pay | Admitting: Physician Assistant

## 2023-01-02 VITALS — BP 138/89 | HR 65 | Resp 15 | Ht 64.0 in | Wt 218.0 lb

## 2023-01-02 DIAGNOSIS — M79671 Pain in right foot: Secondary | ICD-10-CM | POA: Diagnosis present

## 2023-01-02 DIAGNOSIS — M461 Sacroiliitis, not elsewhere classified: Secondary | ICD-10-CM | POA: Diagnosis present

## 2023-01-02 DIAGNOSIS — R7303 Prediabetes: Secondary | ICD-10-CM | POA: Diagnosis present

## 2023-01-02 DIAGNOSIS — I10 Essential (primary) hypertension: Secondary | ICD-10-CM | POA: Diagnosis present

## 2023-01-02 DIAGNOSIS — L4050 Arthropathic psoriasis, unspecified: Secondary | ICD-10-CM | POA: Insufficient documentation

## 2023-01-02 DIAGNOSIS — M19042 Primary osteoarthritis, left hand: Secondary | ICD-10-CM | POA: Diagnosis present

## 2023-01-02 DIAGNOSIS — L409 Psoriasis, unspecified: Secondary | ICD-10-CM | POA: Insufficient documentation

## 2023-01-02 DIAGNOSIS — M79672 Pain in left foot: Secondary | ICD-10-CM | POA: Insufficient documentation

## 2023-01-02 DIAGNOSIS — Z79899 Other long term (current) drug therapy: Secondary | ICD-10-CM | POA: Insufficient documentation

## 2023-01-02 DIAGNOSIS — Z111 Encounter for screening for respiratory tuberculosis: Secondary | ICD-10-CM | POA: Insufficient documentation

## 2023-01-02 DIAGNOSIS — E782 Mixed hyperlipidemia: Secondary | ICD-10-CM | POA: Diagnosis present

## 2023-01-02 DIAGNOSIS — Z96653 Presence of artificial knee joint, bilateral: Secondary | ICD-10-CM | POA: Diagnosis present

## 2023-01-02 DIAGNOSIS — F17200 Nicotine dependence, unspecified, uncomplicated: Secondary | ICD-10-CM | POA: Insufficient documentation

## 2023-01-02 DIAGNOSIS — M65341 Trigger finger, right ring finger: Secondary | ICD-10-CM | POA: Diagnosis present

## 2023-01-02 DIAGNOSIS — M19041 Primary osteoarthritis, right hand: Secondary | ICD-10-CM | POA: Diagnosis present

## 2023-01-02 DIAGNOSIS — F411 Generalized anxiety disorder: Secondary | ICD-10-CM | POA: Insufficient documentation

## 2023-01-02 DIAGNOSIS — M51369 Other intervertebral disc degeneration, lumbar region without mention of lumbar back pain or lower extremity pain: Secondary | ICD-10-CM | POA: Insufficient documentation

## 2023-01-02 DIAGNOSIS — Z8379 Family history of other diseases of the digestive system: Secondary | ICD-10-CM | POA: Insufficient documentation

## 2023-01-02 MED ORDER — PREDNISONE 5 MG PO TABS: ORAL_TABLET | ORAL | 0 refills | Status: DC

## 2023-01-03 LAB — COMPLETE METABOLIC PANEL WITH GFR
AG Ratio: 1.8 (calc) (ref 1.0–2.5)
ALT: 23 U/L (ref 6–29)
AST: 19 U/L (ref 10–35)
Albumin: 4.3 g/dL (ref 3.6–5.1)
Alkaline phosphatase (APISO): 71 U/L (ref 37–153)
BUN: 16 mg/dL (ref 7–25)
CO2: 31 mmol/L (ref 20–32)
Calcium: 9.2 mg/dL (ref 8.6–10.4)
Chloride: 101 mmol/L (ref 98–110)
Creat: 0.77 mg/dL (ref 0.50–1.03)
Globulin: 2.4 g/dL (ref 1.9–3.7)
Glucose, Bld: 112 mg/dL — ABNORMAL HIGH (ref 65–99)
Potassium: 4.4 mmol/L (ref 3.5–5.3)
Sodium: 139 mmol/L (ref 135–146)
Total Bilirubin: 0.4 mg/dL (ref 0.2–1.2)
Total Protein: 6.7 g/dL (ref 6.1–8.1)
eGFR: 92 mL/min/{1.73_m2} (ref >=60)

## 2023-01-04 NOTE — Progress Notes (Signed)
Glucose is 112. Rest of CMP WNL

## 2023-01-16 ENCOUNTER — Other Ambulatory Visit: Payer: Self-pay | Admitting: *Deleted

## 2023-01-16 DIAGNOSIS — L409 Psoriasis, unspecified: Secondary | ICD-10-CM

## 2023-01-16 DIAGNOSIS — L4050 Arthropathic psoriasis, unspecified: Secondary | ICD-10-CM

## 2023-01-16 NOTE — Telephone Encounter (Signed)
Refill request received via fax from  [facility]  for Rasuvo  Last Fill: 06/27/2022  Labs: 12/03/2022 Hgb 15.3, MCV 98.2, MCH 34.5, 01/02/2023 Glucose is 112. Rest of CMP WNL.   Next Visit: 04/04/2023  Last Visit: 01/02/2023  DX: Psoriatic arthritis   Current Dose per office note 01/02/2023: rasuvo 25 mg sq injections once weekly   Okay to refill Rasuvo?

## 2023-01-16 NOTE — Telephone Encounter (Signed)
Attempted to contact the patient and left message for patient to call the office.  

## 2023-01-16 NOTE — Telephone Encounter (Signed)
Please advise patient that she should not be taking diclofenac 75 mg tablet with methotrexate due to high risk of toxicity to liver and kidneys.

## 2023-01-17 NOTE — Telephone Encounter (Signed)
Attempted to contact the patient. Unable to leave a message, voicemail is full.  

## 2023-03-04 IMAGING — CR DG CHEST 2V
2 series · 2 of 2 positions shown · non-contrast
Comparison: None.

CLINICAL DATA: Psoriatic arthritis.  Immunosuppressive therapy.

EXAM:
CHEST - 2 VIEW

[chest pa]
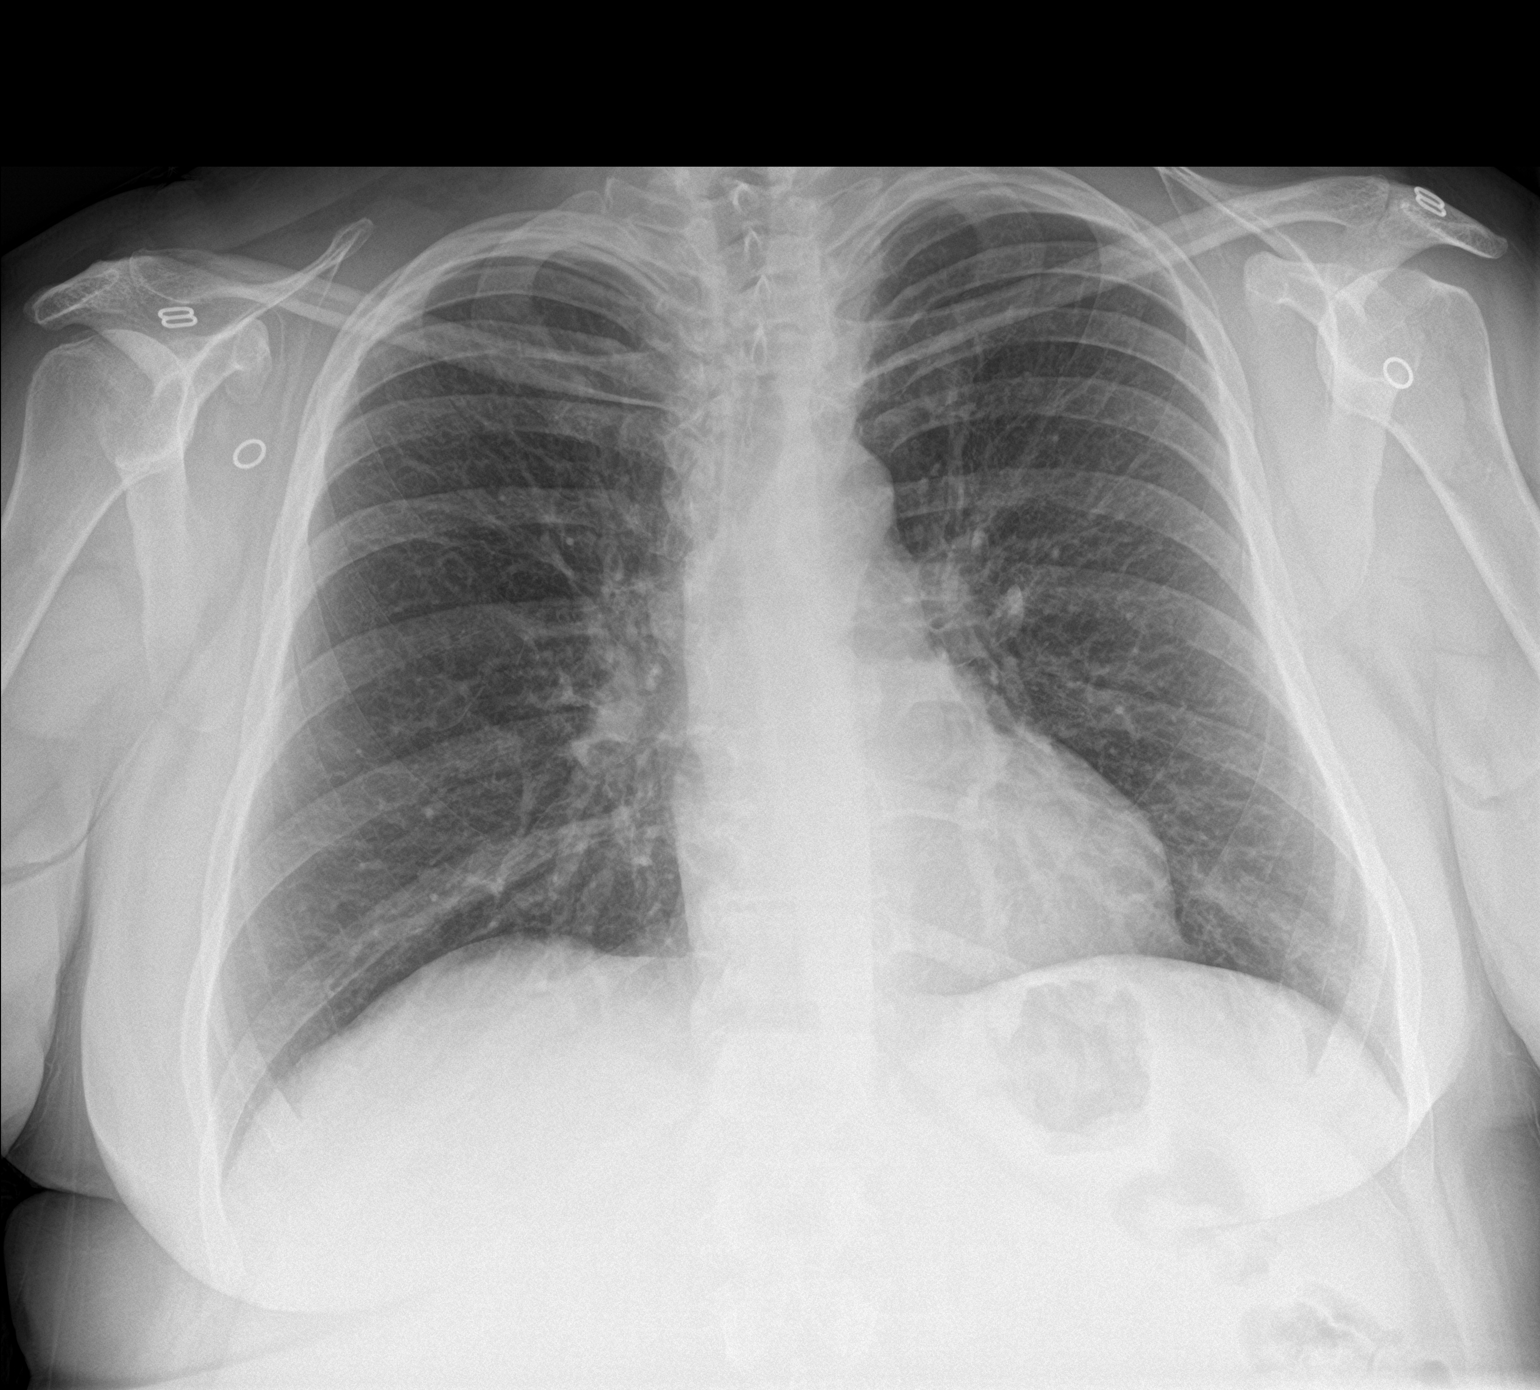

[chest lat]
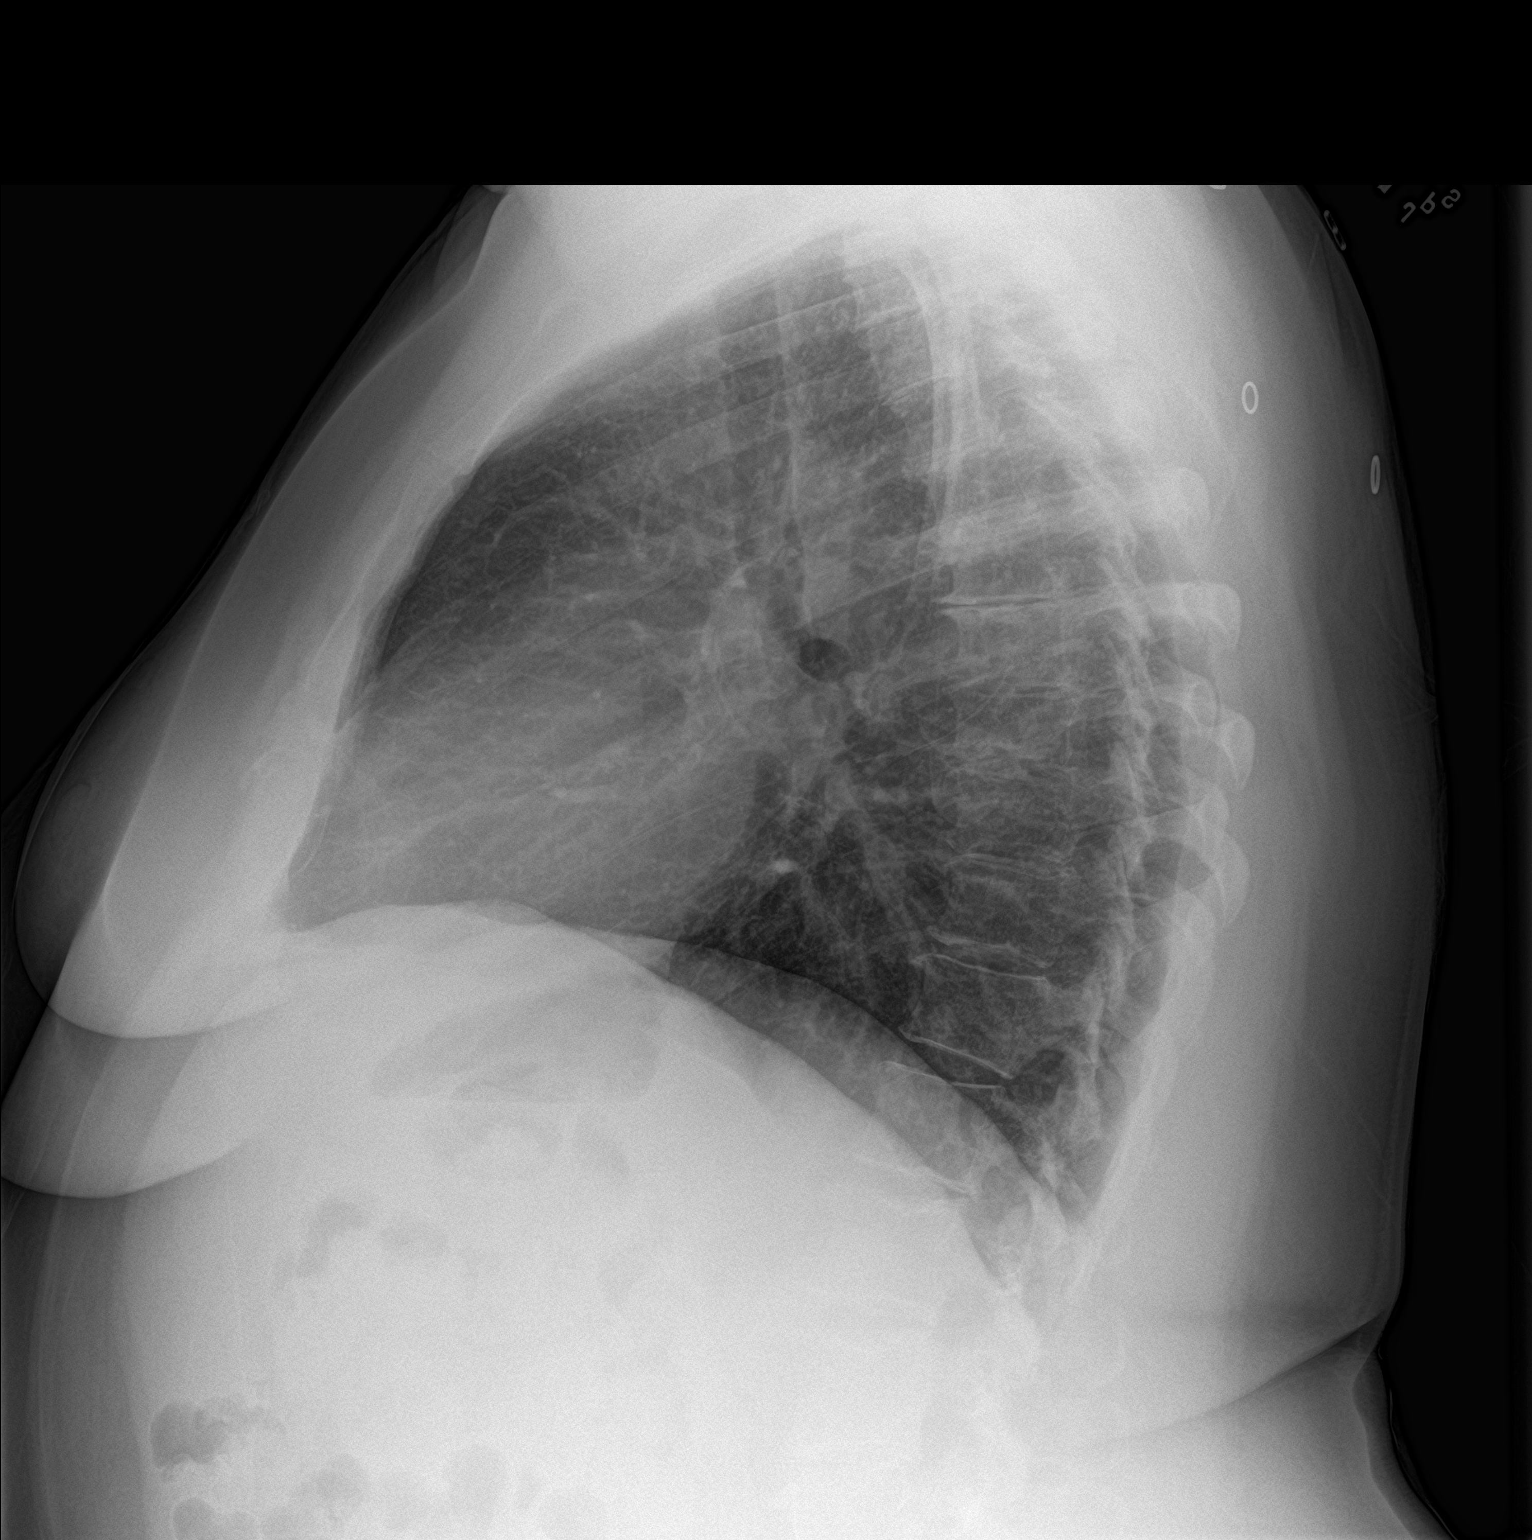

[2 of 2 positions shown; findings below may reference images not displayed]

FINDINGS: The heart size and mediastinal contours are normal. The lungs are
clear. There is no pleural effusion or pneumothorax. No acute
osseous findings are identified. There are degenerative changes
throughout the thoracic spine associated with a mild scoliosis.
IMPRESSION: No active cardiopulmonary process.

## 2023-03-21 NOTE — Progress Notes (Unsigned)
 Office Visit Note  Patient: Sherry Stein             Date of Birth: 05/05/1970           MRN: [id number]             PCP: Barbarann Ehlers Referring: Jordan Hawks, PA-C Visit Date: 04/04/2023 Occupation: @GUAROCC @  Subjective:  Right ring trigger finger   History of Present Illness: Sherry Stein is a 53 y.o. female with history of psoriatic arthritis and osteoarthritis.  Patient prescribed Cosentyx 300 mg sq injections once every 28 days, rasuvo 25 mg sq injections once weekly, and folic acid 2 mg daily.  Patient reports that she has been out of her medications for the past few months.  Patient states that she is in the process of completing paperwork for the patient assistance for Cosentyx but is aware that Rasuvo no longer has a patient assistance program.  She is open to switching back to oral methotrexate going forward.  Patient states that overall her symptoms have been manageable during the winter months.  Patient states that she had a repeat back ablation in January 2025 which was helpful.  She completed physical therapy for her lower back which was also helpful.  She has occasional soreness in her right shoulder but has not needed a repeat cortisone injection.  Patient states that she has had locking and tenderness of her right ring finger and would like an injection performed to alleviate her symptoms.  Her last trigger finger injection was performed on 06/27/2021.  Patient denies any obvious joint swelling at this time.  She continues to have chronic pain in both feet especially if standing for prolonged periods of time.  She denies any Achilles tendinitis or plantar fasciitis at this time.  Patient reports that she will be traveling in March and is concerned about the long flight and increasing her activity level.  Patient would like to have a prednisone taper to take with her on her trip in case she has a flare. She denies any recent or recurrent  infections. She denies any new medical conditions. Patient reports that she had an updated colonoscopy a couple weeks ago and was found to have benign polyps.        Activities of Daily Living:  Patient reports morning stiffness for 2 hours.   Patient Denies nocturnal pain.  Difficulty dressing/grooming: Denies Difficulty climbing stairs: Denies Difficulty getting out of chair: Denies Difficulty using hands for taps, buttons, cutlery, and/or writing: Reports  Review of Systems  Constitutional:  Positive for fatigue.  HENT:  Negative for mouth sores and mouth dryness.   Eyes:  Negative for dryness.  Respiratory:  Negative for shortness of breath.   Cardiovascular:  Negative for chest pain and palpitations.  Gastrointestinal:  Negative for blood in stool, constipation and diarrhea.  Endocrine: Negative for increased urination.  Genitourinary:  Negative for involuntary urination.  Musculoskeletal:  Positive for joint pain, joint pain, myalgias, muscle weakness, morning stiffness, muscle tenderness and myalgias. Negative for gait problem and joint swelling.  Skin:  Positive for rash and sensitivity to sunlight. Negative for color change and hair loss.  Allergic/Immunologic: Positive for susceptible to infections.  Neurological:  Negative for dizziness and headaches.  Hematological:  Negative for swollen glands.  Psychiatric/Behavioral:  Negative for depressed mood and sleep disturbance. The patient is nervous/anxious.     PMFS History:  Patient Active Problem List   Diagnosis Date Noted  .  Hyperlipidemia 08/10/2016  . GAD (generalized anxiety disorder) 08/04/2016  . History of autoimmune disorder 03/20/2016  . Open leg wound, right, sequela 03/20/2016  . Drug therapy 05/10/2015  . Acute frontal sinusitis 05/09/2015  . Allergic conjunctivitis 05/09/2015  . Amenorrhea 05/09/2015  . Benign essential hypertension 05/09/2015  . Depression 05/09/2015  . Foot arch pain 05/09/2015   . Edema 05/09/2015  . Hiatal hernia with GERD without esophagitis 05/09/2015  . Muscle spasm 05/09/2015  . Pain in joint involving multiple sites 05/09/2015  . Psoriasis 05/09/2015  . Menorrhagia with irregular cycle 04/10/2015  . Hammer toe of right foot 04/07/2015  . Pain from implanted hardware 04/07/2015    Past Medical History:  Diagnosis Date  . Arthritis   . Diabetes mellitus without complication (HCC)   . Hypertension     Family History  Problem Relation Age of Onset  . Hypertension Mother   . Diabetes Father   . COPD Father   . Hypertension Father   . Cancer Sister   . High Cholesterol Brother   . Crohn's disease Son    Past Surgical History:  Procedure Laterality Date  . BUNIONECTOMY Bilateral 2007  . CARPAL TUNNEL RELEASE Right 07/2019  . REPLACEMENT TOTAL KNEE Left 03/2019  . REPLACEMENT TOTAL KNEE Right 12/2018  . TOE SURGERY Right    great toe x3  . TONSILLECTOMY AND ADENOIDECTOMY    . TYMPANOSTOMY TUBE PLACEMENT    . WISDOM TOOTH EXTRACTION     84s   Social History   Social History Narrative   ** Merged History Encounter **       Immunization History  Administered Date(s) Administered  . PFIZER(Purple Top)SARS-COV-2 Vaccination 11/24/2019, 12/15/2019  . 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 Pfizer(Comirnaty)Fall Seasonal Vaccine 12 years and older 11/28/2021     Objective: Vital Signs: BP 115/77 (BP Location: Left Arm, Patient Position: Sitting, Cuff Size: Large)   Pulse 75   Resp 14   Ht 5\' 3"  (1.6 m)   Wt 224 lb (101.6 kg)   BMI 39.68 kg/m    Physical Exam Vitals and nursing note reviewed.  Constitutional:      Appearance: She is well-developed.  HENT:     Head: Normocephalic and atraumatic.  Eyes:     Conjunctiva/sclera: Conjunctivae normal.  Cardiovascular:     Rate and Rhythm: Normal rate and regular rhythm.     Heart sounds: Normal heart sounds.  Pulmonary:     Effort: Pulmonary effort is  normal.     Breath sounds: Normal breath sounds.  Abdominal:     General: Bowel sounds are normal.     Palpations: Abdomen is soft.  Musculoskeletal:     Cervical back: Normal range of motion.  Lymphadenopathy:     Cervical: No cervical adenopathy.  Skin:    General: Skin is warm and dry.     Capillary Refill: Capillary refill takes less than 2 seconds.  Neurological:     Mental Status: She is alert and oriented to person, place, and time.  Psychiatric:        Behavior: Behavior normal.     Musculoskeletal Exam: C-spine has good range of motion.  Slightly limited mobility of the lumbar spine.  Shoulder joints have good range of motion with some discomfort and tenderness of the right shoulder.  Elbow joints and wrist joints have good range of motion.  Synovial thickening of the right first MCP joint.  MCP, PIP, and DIP  thickening noted. Right ring trigger finger.  Hip joints have good range of motion with no groin pain.  Bilateral knee replacements have good range of motion.  Forefoot deformity noted bilaterally.  PIP and DIP thickening noted.  No evidence of Achilles tendinitis or plantar fasciitis.  CDAI Exam: CDAI Score: -- Patient Global: --; Provider Global: -- Swollen: --; Tender: -- Joint Exam 04/04/2023   No joint exam has been documented for this visit   There is currently no information documented on the homunculus. Go to the Rheumatology activity and complete the homunculus joint exam.  Investigation: No additional findings.  Imaging: US Guided Needle Placement Result Date: 04/04/2023 Ultrasound guided injection is preferred based studies that show increased duration, increased effect, greater accuracy, decreased procedural pain, increased response rate, and decreased cost with ultrasound guided versus blind injection.   Verbal informed consent obtained.  Time-out conducted.  Noted no overlying erythema, induration, or other signs of local infection. Ultrasound-guided  trigger finger injection: After sterile prep with Betadine, injected 0.5 mL of 1% lidocaine and 20 mg Kenalog using a 27-gauge needle, in the flexor tendon sheath.     Recent Labs: Lab Results  Component Value Date   WBC 8.4 10/03/2022   HGB 15.8 (H) 10/03/2022   PLT 280 10/03/2022   NA 139 01/02/2023   K 4.4 01/02/2023   CL 101 01/02/2023   CO2 31 01/02/2023   GLUCOSE 112 (H) 01/02/2023   BUN 16 01/02/2023   CREATININE 0.77 01/02/2023   BILITOT 0.4 01/02/2023   AST 19 01/02/2023   ALT 23 01/02/2023   PROT 6.7 01/02/2023   CALCIUM 9.2 01/02/2023   GFRAA 105 06/13/2020   QFTBGOLDPLUS NEGATIVE 03/29/2022    Speciality Comments: MTX since2004, Humira-2004-2017-quit working, cosyntex 2018x 8 months, restarted May 11, 2020   FU every 3 months  Procedures:  Ultrasound guided injection is preferred based studies that show increased duration, increased effect, greater accuracy, decreased procedural pain, increased response rate, and decreased cost with ultrasound guided versus blind injection.   Verbal informed consent obtained.  Time-out conducted.  Noted no overlying erythema, induration, or other signs of local infection. Ultrasound-guided trigger finger injection: After sterile prep with Betadine, injected 0.5 mL of 1% lidocaine and 20 mg Kenalog using a 27-gauge needle, in the flexor tendon sheath.     Hand/UE Inj: R ring A1 for trigger finger on 04/04/2023 3:32 PM Indications: pain, tendon swelling and therapeutic Details: 27 G needle, ultrasound-guided volar approach Medications: 0.5 mL lidocaine 1 %; 20 mg triamcinolone acetonide 40 MG/ML Aspirate: 0 mL Procedure, treatment alternatives, risks and benefits explained, specific risks discussed. Consent was given by the patient. Immediately prior to procedure a time out was called to verify the correct patient, procedure, equipment, support staff and site/side marked as required. Patient was prepped and draped in the usual  sterile fashion.    Allergies: Patient has no known allergies.    Assessment / Plan:     Visit Diagnoses: Psoriatic arthritis (HCC) - Dxd in 2004 by Dr. Coral Spikes.  She has been on methotrexate since 2004.  Humira discontinued in 01/2016, Cosentyx 2018 for 8 months-restarted 05/11/2020: Patient has no synovitis or dactylitis on examination today.  She has not had any recent psoriatic arthritis flares.  She is prescribed Cosentyx 300 mg sq injections every 28 days and Rasuvo 25 mg sq injections once weekly.  She has been out of both prescriptions for the past few months and is currently in the process of  renewing patient assistance for Cosentyx.  A sample of Cosentyx was provided to the patient today in the office.  Unfortunately [facility] no longer provides patient assistance so the patient would like to switch back to oral methotrexate.  A new prescription for methotrexate 10 tablets by mouth once weekly were sent to the pharmacy today.  She has not noticed any active inflammation during the gap in therapy but has had some increased arthralgias which she attributes to previous joint damage.  She is having some soreness in her right shoulder and has pain in both feet if standing for prolonged periods of time.  Patient has an upcoming trip in March 2025 and is concerned about her increase in activity and riding on an airplane.  She has requested a prednisone taper to take with her on her trip in case she has a flare.  A prednisone taper starting at 20 mg tapering by 5 mg every 4 days was sent to the pharmacy today.  Instructions were provided.  Patient was advised to notify us if she develops any new or worsening symptoms.  She plans on resuming Cosentyx today and will be picking up her new prescription for oral methotrexate.  She will follow-up in the office in 3 months or sooner if needed.  Psoriasis: No active psoriasis at this time.  High risk medication use - Cosentyx 300 mg sq injections once every 28 days,  methotrexate 10 tablets by mouth once weekly, and folic acid 2 mg daily. No longer qualifies for patient assistance for Rasuvo. CMP updated on 01/02/23.  Orders for CBC and CMP were released today. TB gold negative on 03/29/22.  Order for TB gold released today. No recent or recurrent infections.  Discussed the importance of holding cosentyx and rasuvo if she develops signs or symptoms of an infection and to resume once the infection has completely cleared.  - Plan: COMPLETE METABOLIC PANEL WITH GFR, CBC with Differential/Platelet, QuantiFERON-TB Gold Plus  Screening for tuberculosis - Order for TB gold released today. Plan: QuantiFERON-TB Gold Plus  Primary osteoarthritis of both hands: Patient has MCP, PIP, DIP thickening but no active synovitis on examination today.  She has been experiencing locking of the right ring finger and requested an ultrasound-guided cortisone injection today.  She tolerated the procedure well and procedure note was completed above.  Trigger finger, right ring finger -She presents today with a recurrence of tenderness and locking of the right ring finger.  Previously had a right ring trigger finger injection performed on 06/27/2021.  An ultrasound-guided right ring trigger finger injection was performed.  She tolerated procedure well.  Procedure note was completed above.  Aftercare was discussed.  Plan: US Guided Needle Placement, Hand/UE Inj: R ring A1  Status post total knee replacement, bilateral: Doing well.  Good range of motion with no discomfort.  Pain in both feet: Chronic pain. S/p previous surgeries. Dr. Lelon Mast.  Difficulty standing or walking prolonged distances due to the discomfort.  Bilateral sacroiliitis (HCC): She has no SI joint pain at this time.  Degeneration of intervertebral disc of lumbar region without discogenic back pain or lower extremity pain: MRI of the lumbar spine 06/21/22: Severe DDD.Grade 1 retrolisthesis.Facet arthropathy.Dr. Wilmon Pali.  Patient underwent ablation on 07/30/2022 Patient had a repeat ablation performed in January 2025 which has alleviated her discomfort.  She has completed physical therapy.  Other medical conditions are listed as follows:  Family history of Crohn's disease  Benign essential hypertension: Blood pressure was 115/77 today in the  office.  Mixed hyperlipidemia  Prediabetes  GAD (generalized anxiety disorder)  Smoker  Hypermobility arthralgia    Orders: Orders Placed This Encounter  Procedures  . Hand/UE Inj: R ring A1  . US Guided Needle Placement  . COMPLETE METABOLIC PANEL WITH GFR  . CBC with Differential/Platelet  . QuantiFERON-TB Gold Plus   Meds ordered this encounter  Medications  . predniSONE (DELTASONE) 5 MG tablet    Sig: Take 4 tabs po x 4 days, 3  tabs po x 4 days, 2  tabs po x 4 days, 1  tab po x 4 days    Dispense:  40 tablet    Refill:  0  . methotrexate (RHEUMATREX) 2.5 MG tablet    Sig: Take 10 tablets (25 mg total) by mouth once a week. Caution:Chemotherapy. Protect from light.    Dispense:  120 tablet    Refill:  0     Follow-Up Instructions: Return in 3 months (on 07/02/2023) for Psoriatic arthritis, Osteoarthritis.   Gearldine Bienenstock, PA-C  Note - This record has been created using Dragon software.  Chart creation errors have been sought, but may not always  have been located. Such creation errors do not reflect on  the standard of medical care.

## 2023-04-04 ENCOUNTER — Ambulatory Visit: Payer: Medicare Other | Attending: Physician Assistant | Admitting: Physician Assistant

## 2023-04-04 ENCOUNTER — Telehealth: Payer: Self-pay | Admitting: *Deleted

## 2023-04-04 ENCOUNTER — Ambulatory Visit: Payer: Medicare Other

## 2023-04-04 ENCOUNTER — Encounter: Payer: Self-pay | Admitting: Physician Assistant

## 2023-04-04 VITALS — BP 115/77 | HR 75 | Resp 14 | Ht 63.0 in | Wt 224.0 lb

## 2023-04-04 DIAGNOSIS — M19042 Primary osteoarthritis, left hand: Secondary | ICD-10-CM | POA: Diagnosis present

## 2023-04-04 DIAGNOSIS — L409 Psoriasis, unspecified: Secondary | ICD-10-CM | POA: Insufficient documentation

## 2023-04-04 DIAGNOSIS — M65341 Trigger finger, right ring finger: Secondary | ICD-10-CM | POA: Insufficient documentation

## 2023-04-04 DIAGNOSIS — R7303 Prediabetes: Secondary | ICD-10-CM | POA: Diagnosis present

## 2023-04-04 DIAGNOSIS — M79671 Pain in right foot: Secondary | ICD-10-CM | POA: Diagnosis present

## 2023-04-04 DIAGNOSIS — M19041 Primary osteoarthritis, right hand: Secondary | ICD-10-CM | POA: Diagnosis not present

## 2023-04-04 DIAGNOSIS — F411 Generalized anxiety disorder: Secondary | ICD-10-CM | POA: Diagnosis present

## 2023-04-04 DIAGNOSIS — M461 Sacroiliitis, not elsewhere classified: Secondary | ICD-10-CM | POA: Insufficient documentation

## 2023-04-04 DIAGNOSIS — Z111 Encounter for screening for respiratory tuberculosis: Secondary | ICD-10-CM | POA: Insufficient documentation

## 2023-04-04 DIAGNOSIS — M51369 Other intervertebral disc degeneration, lumbar region without mention of lumbar back pain or lower extremity pain: Secondary | ICD-10-CM | POA: Insufficient documentation

## 2023-04-04 DIAGNOSIS — F17200 Nicotine dependence, unspecified, uncomplicated: Secondary | ICD-10-CM | POA: Diagnosis present

## 2023-04-04 DIAGNOSIS — E782 Mixed hyperlipidemia: Secondary | ICD-10-CM | POA: Insufficient documentation

## 2023-04-04 DIAGNOSIS — I10 Essential (primary) hypertension: Secondary | ICD-10-CM | POA: Diagnosis present

## 2023-04-04 DIAGNOSIS — L4050 Arthropathic psoriasis, unspecified: Secondary | ICD-10-CM | POA: Insufficient documentation

## 2023-04-04 DIAGNOSIS — Z96653 Presence of artificial knee joint, bilateral: Secondary | ICD-10-CM | POA: Insufficient documentation

## 2023-04-04 DIAGNOSIS — Z8379 Family history of other diseases of the digestive system: Secondary | ICD-10-CM | POA: Diagnosis present

## 2023-04-04 DIAGNOSIS — M2550 Pain in unspecified joint: Secondary | ICD-10-CM | POA: Insufficient documentation

## 2023-04-04 DIAGNOSIS — Z79899 Other long term (current) drug therapy: Secondary | ICD-10-CM | POA: Diagnosis not present

## 2023-04-04 DIAGNOSIS — M79672 Pain in left foot: Secondary | ICD-10-CM | POA: Insufficient documentation

## 2023-04-04 MED ORDER — TRIAMCINOLONE ACETONIDE 40 MG/ML IJ SUSP
20.0000 mg | INTRAMUSCULAR | Status: AC | PRN
  Administered 2023-04-04: 20 mg

## 2023-04-04 MED ORDER — PREDNISONE 5 MG PO TABS: ORAL_TABLET | ORAL | 0 refills | Status: DC

## 2023-04-04 MED ORDER — METHOTREXATE SODIUM 2.5 MG PO TABS: 25.0000 mg | ORAL_TABLET | ORAL | 0 refills | Status: DC

## 2023-04-04 MED ORDER — LIDOCAINE HCL 1 % IJ SOLN
0.5000 mL | INTRAMUSCULAR | Status: AC | PRN
  Administered 2023-04-04: .5 mL

## 2023-04-04 NOTE — Telephone Encounter (Signed)
 Medication Samples have been provided to the patient.  Drug name: Cosentyx Strength: 300 mg/150 mg/ml Qty: 1 LOT: [id number] Exp.Date: 05/05/2024  Dosing instructions: Inject 2 mls (300 mg) into the skin every 28 days

## 2023-04-05 NOTE — Progress Notes (Signed)
 Glucose is 110. Rest of CMP WNL.   Hemoglobin and hematocrit remain borderline elevated and continue to trend up.  Rest of CBC WNL.  We will continue to monitor and these levels continue to trend up we will place a referral to hematology.

## 2023-04-05 NOTE — Progress Notes (Signed)
 These levels could be elevated due to smoking.

## 2023-04-06 LAB — QUANTIFERON-TB GOLD PLUS
Mitogen-NIL: 9.76 [IU]/mL
NIL: 0.02 [IU]/mL
QuantiFERON-TB Gold Plus: NEGATIVE
TB1-NIL: 0 [IU]/mL
TB2-NIL: <0 [IU]/mL

## 2023-04-06 LAB — CBC WITH DIFFERENTIAL/PLATELET
Absolute Lymphocytes: 2455 {cells}/uL (ref 850–3900)
Absolute Monocytes: 616 {cells}/uL (ref 200–950)
Basophils Absolute: 53 {cells}/uL (ref 0–200)
Basophils Relative: 0.6 %
Eosinophils Absolute: 334 {cells}/uL (ref 15–500)
Eosinophils Relative: 3.8 %
HCT: 47.7 % — ABNORMAL HIGH (ref 35.0–45.0)
Hemoglobin: 16 g/dL — ABNORMAL HIGH (ref 11.7–15.5)
MCH: 32.2 pg (ref 27.0–33.0)
MCHC: 33.5 g/dL (ref 32.0–36.0)
MCV: 96 fL (ref 80.0–100.0)
MPV: 10.3 fL (ref 7.5–12.5)
Monocytes Relative: 7 %
Neutro Abs: 5342 {cells}/uL (ref 1500–7800)
Neutrophils Relative %: 60.7 %
Platelets: 279 10*3/uL (ref 140–400)
RBC: 4.97 10*6/uL (ref 3.80–5.10)
RDW: 12.2 % (ref 11.0–15.0)
Total Lymphocyte: 27.9 %
WBC: 8.8 10*3/uL (ref 3.8–10.8)

## 2023-04-06 LAB — COMPLETE METABOLIC PANEL WITH GFR
AG Ratio: 2 (calc) (ref 1.0–2.5)
ALT: 23 U/L (ref 6–29)
AST: 19 U/L (ref 10–35)
Albumin: 4.5 g/dL (ref 3.6–5.1)
Alkaline phosphatase (APISO): 67 U/L (ref 37–153)
BUN: 16 mg/dL (ref 7–25)
CO2: 26 mmol/L (ref 20–32)
Calcium: 9.5 mg/dL (ref 8.6–10.4)
Chloride: 103 mmol/L (ref 98–110)
Creat: 0.61 mg/dL (ref 0.50–1.03)
Globulin: 2.3 g/dL (ref 1.9–3.7)
Glucose, Bld: 110 mg/dL — ABNORMAL HIGH (ref 65–99)
Potassium: 4.1 mmol/L (ref 3.5–5.3)
Sodium: 139 mmol/L (ref 135–146)
Total Bilirubin: 0.4 mg/dL (ref 0.2–1.2)
Total Protein: 6.8 g/dL (ref 6.1–8.1)
eGFR: 106 mL/min/{1.73_m2} (ref >=60)

## 2023-04-08 NOTE — Progress Notes (Signed)
 TB gold negative

## 2023-04-09 NOTE — Telephone Encounter (Signed)
 Received signed patient form. MD form signed by Sherron Ales, PA-C.  Submitted Patient Assistance RENEWAL Application to [employer] for COSENTYX SQ along with provider portion, patient portion, PA, medication list, insurance card copy and income documents. Will update patient when we receive a response.  Phone: [phone] Fax: [phone]

## 2023-04-22 NOTE — Telephone Encounter (Signed)
 Received fax from [employer] (dated 04/11/2023) stating that they are requiring Tax Return as proof of income. Spoke with patient - she states she did not file taxes last year. Advised her that she will need to complete attestation letter from [employer] stating this. She plans to follow-up with [employer] today.  Patient ID: [id number]  Chesley Mires, PharmD, MPH, BCPS, CPP Clinical Pharmacist (Rheumatology and Pulmonology)

## 2023-06-11 NOTE — Telephone Encounter (Signed)
 Spoke with [employer] representative to discuss status of Cosenytx patient assistance application.Patient was previously informed to contact [employer], explain she does not pay taxes, and request an attestation letter. Per representative, they have not received any call from the patient to request an attestation letter.   Attempted to call patient to discuss this. Unable to reach her. LVM with clinic call back number.   [employer] Phone: [phone] Fax: [phone]  Tolu Merton Wadlow, PharmD [facility] PGY-1

## 2023-06-13 NOTE — Telephone Encounter (Signed)
 Attempted to call patient to discuss if she has reached out to [employer] and submitted an attestation letter. Unable to reach her. LVM.   Tolu Wynn Kernes, PharmD [facility] PGY-1

## 2023-06-20 NOTE — Progress Notes (Unsigned)
 Office Visit Note  Patient: Sherry Stein             Date of Birth: [birth year]           MRN: [id number]             PCP: Helena Loach Referring: Tova Fresh, PA-C Visit Date: 07/04/2023 Occupation: @GUAROCC @  Subjective:  Medication monitoring   History of Present Illness: Sherry Stein is a 53 y.o. female with history of psoriatic arthritis.  Patient remains on methotrexate  10 tablets by mouth once weekly and folic acid  2 mg daily.  Patient reports that she has been off of Cosentyx  since December 2024.  Patient states that she did not submit paperwork for renewal of Cosentyx  through [employer] patient assistance program this year.  Patient states that she has not noticed any new or worsening symptoms since being off of Cosentyx .  She has been taking diclofenac 75 mg twice daily and occasionally takes Tylenol arthritis as needed for pain relief.  Patient states that earlier this spring she had exacerbation of left-sided neck pain.  Patient is scheduled for a nerve block tomorrow in anticipation of scheduling an ablation.  The last ablation she had performed in her lower back was in January 2025 which has been effective.  Patient continues to have some inflammation in the right first MCP joint but denies any other joint swelling at this time.  She denies any active psoriasis.  She denies any Achilles tendinitis or plantar fasciitis.  She denies any SI joint pain.    Activities of Daily Living:  Patient reports morning stiffness for 2 hours.   Patient Denies nocturnal pain.  Difficulty dressing/grooming: Denies Difficulty climbing stairs: Denies Difficulty getting out of chair: Denies Difficulty using hands for taps, buttons, cutlery, and/or writing: Reports  Review of Systems  Constitutional:  Positive for fatigue.  HENT:  Negative for mouth sores and mouth dryness.   Eyes:  Negative for dryness.  Respiratory:  Negative for shortness of breath.    Cardiovascular:  Negative for chest pain and palpitations.  Gastrointestinal:  Negative for blood in stool, constipation and diarrhea.  Endocrine: Negative for increased urination.  Genitourinary:  Negative for involuntary urination.  Musculoskeletal:  Positive for joint pain, joint pain, joint swelling, myalgias, morning stiffness, muscle tenderness and myalgias. Negative for gait problem and muscle weakness.  Skin:  Negative for color change, rash, hair loss and sensitivity to sunlight.  Allergic/Immunologic: Positive for susceptible to infections.  Neurological:  Negative for dizziness and headaches.  Hematological:  Negative for swollen glands.  Psychiatric/Behavioral:  Negative for depressed mood and sleep disturbance. The patient is not nervous/anxious.     PMFS History:  Patient Active Problem List   Diagnosis Date Noted   Hyperlipidemia 08/10/2016   GAD (generalized anxiety disorder) 08/04/2016   History of autoimmune disorder 03/20/2016   Open leg wound, right, sequela 03/20/2016   Drug therapy 05/10/2015   Acute frontal sinusitis 05/09/2015   Allergic conjunctivitis 05/09/2015   Amenorrhea 05/09/2015   Benign essential hypertension 05/09/2015   Depression 05/09/2015   Foot arch pain 05/09/2015   Edema 05/09/2015   Hiatal hernia with GERD without esophagitis 05/09/2015   Muscle spasm 05/09/2015   Pain in joint involving multiple sites 05/09/2015   Psoriasis 05/09/2015   Menorrhagia with irregular cycle 04/10/2015   Hammer toe of right foot 04/07/2015   Pain from implanted hardware 04/07/2015    Past Medical History:  Diagnosis Date  Arthritis    Diabetes mellitus without complication (HCC)    Hypertension     Family History  Problem Relation Age of Onset   Hypertension Mother    Diabetes Father    COPD Father    Hypertension Father    Cancer Sister    High Cholesterol Brother    Crohn's disease Son    Past Surgical History:  Procedure Laterality Date    BUNIONECTOMY Bilateral 2007   CARPAL TUNNEL RELEASE Right 07/2019   REPLACEMENT TOTAL KNEE Left 03/2019   REPLACEMENT TOTAL KNEE Right 12/2018   TOE SURGERY Right    great toe x3   TONSILLECTOMY AND ADENOIDECTOMY     TYMPANOSTOMY TUBE PLACEMENT     WISDOM TOOTH EXTRACTION     20s   Social History   Social History Narrative   ** Merged History Encounter **       Immunization History  Administered Date(s) Administered   PFIZER(Purple Top)SARS-COV-2 Vaccination 11/24/2019, 12/15/2019   Pfizer Covid-19 Vaccine Bivalent Booster 33yrs & up 03/01/2021   Pfizer(Comirnaty)Fall Seasonal Vaccine 12 years and older 11/28/2021     Objective: Vital Signs: BP 121/79 (BP Location: Left Arm, Patient Position: Sitting, Cuff Size: Normal)   Pulse 75   Resp 16   Ht 5\' 4"  (1.626 m)   Wt 225 lb (102.1 kg)   BMI 38.62 kg/m    Physical Exam Vitals and nursing note reviewed.  Constitutional:      Appearance: She is well-developed.  HENT:     Head: Normocephalic and atraumatic.  Eyes:     Conjunctiva/sclera: Conjunctivae normal.  Cardiovascular:     Rate and Rhythm: Normal rate and regular rhythm.     Heart sounds: Normal heart sounds.  Pulmonary:     Effort: Pulmonary effort is normal.     Breath sounds: Normal breath sounds.  Abdominal:     General: Bowel sounds are normal.     Palpations: Abdomen is soft.  Musculoskeletal:     Cervical back: Normal range of motion.  Lymphadenopathy:     Cervical: No cervical adenopathy.  Skin:    General: Skin is warm and dry.     Capillary Refill: Capillary refill takes less than 2 seconds.  Neurological:     Mental Status: She is alert and oriented to person, place, and time.  Psychiatric:        Behavior: Behavior normal.      Musculoskeletal Exam: C-spine has discomfort with lateral rotation especially to the left.  Slightly limited mobility of the lumbar spine.  No SI joint tenderness upon palpation.  Shoulder joints have good range of  motion with some discomfort in the right shoulder.  Elbow joints and wrist joints have good range of motion.  Synovial thickening, tenderness, and mild inflammation was noted in the right first MCP joint.  Synovial thickening of MCPs, PIPs, DIPs.  Hip joints have good range of motion with no groin pain.  Bilateral knee joint replacements have good range of motion.  Forefoot deformity noted bilaterally.  No evidence of Achilles tendinitis or plantar fasciitis.  PIP and DIP thickening noted.  CDAI Exam: CDAI Score: -- Patient Global: --; Provider Global: -- Swollen: --; Tender: -- Joint Exam 07/04/2023   No joint exam has been documented for this visit   There is currently no information documented on the homunculus. Go to the Rheumatology activity and complete the homunculus joint exam.  Investigation: No additional findings.  Imaging: No results found.  Recent  Labs: Lab Results  Component Value Date   WBC 8.8 04/04/2023   HGB 16.0 (H) 04/04/2023   PLT 279 04/04/2023   NA 139 04/04/2023   K 4.1 04/04/2023   CL 103 04/04/2023   CO2 26 04/04/2023   GLUCOSE 110 (H) 04/04/2023   BUN 16 04/04/2023   CREATININE 0.61 04/04/2023   BILITOT 0.4 04/04/2023   AST 19 04/04/2023   ALT 23 04/04/2023   PROT 6.8 04/04/2023   CALCIUM 9.5 04/04/2023   GFRAA 105 06/13/2020   QFTBGOLDPLUS NEGATIVE 04/04/2023    Speciality Comments: MTX since2004, Humira-2004-2017-quit working, cosyntex 2018x 8 months, restarted May 11, 2020   FU every 3 months  Procedures:  No procedures performed Allergies: Patient has no known allergies.    Assessment / Plan:     Visit Diagnoses: Psoriatic arthritis (HCC) - Dxd in 2004 by Dr. Levitin.  She has been on methotrexate  since 2004.  Humira discontinued in 01/2016, Cosentyx  2018 for 8 months-restarted 05/11/2020-d/c in December 2024: Patient has ongoing inflammation in the right first MCP joint but has no other joint inflammation on examination today.  She  has no SI joint tenderness upon palpation.  No evidence of Achilles tendinitis or plantar fasciitis. No flare of psoriasis. Patient is been taking diclofenac 75 mg twice daily as needed for pain relief.  She remains on methotrexate  10 tablets by mouth once weekly and folic acid  2 mg daily.  She has been off of Cosentyx  since December 2024 due to not submitting patient assistance documents this year.  She has not noticed any new or worsening symptoms while being off of Cosentyx  and is apprehensive to resume at this time.  Patient would like to monitor her symptoms closely and will notify us  if she develops any signs or symptoms of a flare.  She will remain on Cosentyx  as monotherapy.  She will follow-up in the office in 3 months or sooner if needed.  Psoriasis: No active psoriasis at this time.  She will notify us  if she develops signs or symptoms of a flare.  High risk medication use -Methotrexate  10 tablets by mouth once weekly and folic acid  2 mg daily.  Patient has been off of Cosentyx  since December 2024. CBC and CMP updated on 04/04/23. Orders for CBC and CMP released today.  TB gold negative on 04/04/23.   No recent or recurrent infections. Discussed the importance of holding cosentyx  if she develops signs or symptoms of an infection and to resume once the infection has completely cleared.  - Plan: CBC with Differential/Platelet, Comprehensive metabolic panel with GFR  Primary osteoarthritis of both hands: Patient has ongoing inflammation in the right first MCP joint but has no other synovitis or dactylitis on exam.  Offered to schedule an ultrasound-guided right first MCP joint cortisone injection but she would like to hold off at this time.  She will notify us  if her symptoms persist or worsen.  Trigger finger, right ring finger - Previously had a right ring trigger finger injection performed on 06/27/2021. repeat injection 04/04/23  Status post total knee replacement, bilateral: She has been  experiencing some increased discomfort and instability in the right knee replacement.  Discussed that she may benefit from using a compression sleeve or brace for support especially on uneven terrain.  Her left knee replacement is doing well.  No warmth or effusion noted.  She has difficulty standing or walking for long distances.  Pain in both feet - Chronic pain. S/p previous surgeries. Dr.  Daws.  Overall discomfort in her feet has been manageable.  She wears oofs daily for support.  She has no evidence of Achilles tendinitis or plantar fasciitis at this time.  She has difficulty walking and standing for long peers of time due to previous damage involving both feet.  Bilateral sacroiliitis [facility]): No SI joint tenderness on examination today.  Degeneration of intervertebral disc of lumbar region without discogenic back pain or lower extremity pain - MRI 06/21/22: Severe DDD.Grade 1 retrolisthesis.Facet arthropathy.Dr. Rogelia Clarks. ablation on 07/30/2022- repeat ablation performed in January 2025--overall her lower back pain has been manageable.  Other medical conditions are listed as follows:  Family history of Crohn's disease  Benign essential hypertension: Blood pressure was 121/79 today in the office.  Mixed hyperlipidemia  Prediabetes  GAD (generalized anxiety disorder)  Smoker  Hypermobility arthralgia  Orders: Orders Placed This Encounter  Procedures   CBC with Differential/Platelet   Comprehensive metabolic panel with GFR   No orders of the defined types were placed in this encounter.   Follow-Up Instructions: Return in about 3 months (around 10/04/2023) for Psoriatic arthritis.   Romayne Clubs, PA-C  Note - This record has been created using Dragon software.  Chart creation errors have been sought, but may not always  have been located. Such creation errors do not reflect on  the standard of medical care.

## 2023-06-25 ENCOUNTER — Other Ambulatory Visit: Payer: Self-pay | Admitting: Physician Assistant

## 2023-06-25 NOTE — Telephone Encounter (Signed)
 Last Fill: 04/04/2023  Labs: 04/04/2023 Glucose is 110. Rest of CMP WNL.     Next Visit: 07/04/2023  Last Visit: 04/04/2023  DX: Psoriatic arthritis   Current Dose per office note 04/04/2023: methotrexate  10 tablets by mouth once weekly   Okay to refill Methotrexate ?

## 2023-07-04 ENCOUNTER — Encounter: Payer: Self-pay | Admitting: Physician Assistant

## 2023-07-04 ENCOUNTER — Ambulatory Visit: Payer: Medicare Other | Attending: Physician Assistant | Admitting: Physician Assistant

## 2023-07-04 VITALS — BP 121/79 | HR 75 | Resp 16 | Ht 64.0 in | Wt 225.0 lb

## 2023-07-04 DIAGNOSIS — L409 Psoriasis, unspecified: Secondary | ICD-10-CM

## 2023-07-04 DIAGNOSIS — M2550 Pain in unspecified joint: Secondary | ICD-10-CM | POA: Diagnosis present

## 2023-07-04 DIAGNOSIS — R7303 Prediabetes: Secondary | ICD-10-CM

## 2023-07-04 DIAGNOSIS — M65341 Trigger finger, right ring finger: Secondary | ICD-10-CM

## 2023-07-04 DIAGNOSIS — F17200 Nicotine dependence, unspecified, uncomplicated: Secondary | ICD-10-CM | POA: Diagnosis present

## 2023-07-04 DIAGNOSIS — I10 Essential (primary) hypertension: Secondary | ICD-10-CM

## 2023-07-04 DIAGNOSIS — Z8379 Family history of other diseases of the digestive system: Secondary | ICD-10-CM | POA: Diagnosis present

## 2023-07-04 DIAGNOSIS — L4050 Arthropathic psoriasis, unspecified: Secondary | ICD-10-CM

## 2023-07-04 DIAGNOSIS — F411 Generalized anxiety disorder: Secondary | ICD-10-CM

## 2023-07-04 DIAGNOSIS — M19042 Primary osteoarthritis, left hand: Secondary | ICD-10-CM | POA: Diagnosis present

## 2023-07-04 DIAGNOSIS — M19041 Primary osteoarthritis, right hand: Secondary | ICD-10-CM

## 2023-07-04 DIAGNOSIS — Z79899 Other long term (current) drug therapy: Secondary | ICD-10-CM | POA: Diagnosis present

## 2023-07-04 DIAGNOSIS — M461 Sacroiliitis, not elsewhere classified: Secondary | ICD-10-CM

## 2023-07-04 DIAGNOSIS — M79671 Pain in right foot: Secondary | ICD-10-CM | POA: Insufficient documentation

## 2023-07-04 DIAGNOSIS — M79672 Pain in left foot: Secondary | ICD-10-CM

## 2023-07-04 DIAGNOSIS — E782 Mixed hyperlipidemia: Secondary | ICD-10-CM

## 2023-07-04 DIAGNOSIS — Z96653 Presence of artificial knee joint, bilateral: Secondary | ICD-10-CM | POA: Diagnosis present

## 2023-07-04 DIAGNOSIS — M51369 Other intervertebral disc degeneration, lumbar region without mention of lumbar back pain or lower extremity pain: Secondary | ICD-10-CM

## 2023-07-04 LAB — CBC WITH DIFFERENTIAL/PLATELET
Absolute Lymphocytes: 2537 {cells}/uL (ref 850–3900)
Absolute Monocytes: 589 {cells}/uL (ref 200–950)
Basophils Absolute: 48 {cells}/uL (ref 0–200)
Basophils Relative: 0.5 %
Eosinophils Absolute: 333 {cells}/uL (ref 15–500)
Eosinophils Relative: 3.5 %
HCT: 44.7 % (ref 35.0–45.0)
Hemoglobin: 14.7 g/dL (ref 11.7–15.5)
MCH: 32.5 pg (ref 27.0–33.0)
MCHC: 32.9 g/dL (ref 32.0–36.0)
MCV: 98.9 fL (ref 80.0–100.0)
MPV: 10.4 fL (ref 7.5–12.5)
Monocytes Relative: 6.2 %
Neutro Abs: 5995 {cells}/uL (ref 1500–7800)
Neutrophils Relative %: 63.1 %
Platelets: 275 10*3/uL (ref 140–400)
RBC: 4.52 10*6/uL (ref 3.80–5.10)
RDW: 14.1 % (ref 11.0–15.0)
Total Lymphocyte: 26.7 %
WBC: 9.5 10*3/uL (ref 3.8–10.8)

## 2023-07-04 LAB — COMPREHENSIVE METABOLIC PANEL WITH GFR
AG Ratio: 2 (calc) (ref 1.0–2.5)
ALT: 22 U/L (ref 6–29)
AST: 18 U/L (ref 10–35)
Albumin: 4.5 g/dL (ref 3.6–5.1)
Alkaline phosphatase (APISO): 62 U/L (ref 37–153)
BUN: 18 mg/dL (ref 7–25)
CO2: 30 mmol/L (ref 20–32)
Calcium: 9.2 mg/dL (ref 8.6–10.4)
Chloride: 102 mmol/L (ref 98–110)
Creat: 0.74 mg/dL (ref 0.50–1.03)
Globulin: 2.2 g/dL (ref 1.9–3.7)
Glucose, Bld: 127 mg/dL — ABNORMAL HIGH (ref 65–99)
Potassium: 4.2 mmol/L (ref 3.5–5.3)
Sodium: 138 mmol/L (ref 135–146)
Total Bilirubin: 0.5 mg/dL (ref 0.2–1.2)
Total Protein: 6.7 g/dL (ref 6.1–8.1)
eGFR: 96 mL/min/{1.73_m2} (ref >=60)

## 2023-07-05 ENCOUNTER — Ambulatory Visit: Payer: Self-pay | Admitting: Physician Assistant

## 2023-07-05 NOTE — Progress Notes (Signed)
Glucose is 127. Rest of CMP WNL.  CBC WNL.

## 2023-07-17 NOTE — Telephone Encounter (Signed)
 Per OV note from 07/04/23: She has been off of Cosentyx  since December 2024 due to not submitting patient assistance documents this year. She has not noticed any new or worsening symptoms while being off of Cosentyx  and is apprehensive to resume at this time.   Closing follow-up at this time  Geraldene Kleine, PharmD, MPH, BCPS, CPP Clinical Pharmacist (Rheumatology and Pulmonology)

## 2023-08-22 ENCOUNTER — Ambulatory Visit: Attending: Rheumatology | Admitting: Rheumatology

## 2023-08-22 ENCOUNTER — Ambulatory Visit

## 2023-08-22 ENCOUNTER — Telehealth: Payer: Self-pay

## 2023-08-22 ENCOUNTER — Encounter: Payer: Self-pay | Admitting: Rheumatology

## 2023-08-22 VITALS — BP 113/71 | HR 69 | Ht 63.0 in | Wt 219.0 lb

## 2023-08-22 DIAGNOSIS — Z96653 Presence of artificial knee joint, bilateral: Secondary | ICD-10-CM | POA: Insufficient documentation

## 2023-08-22 DIAGNOSIS — F411 Generalized anxiety disorder: Secondary | ICD-10-CM | POA: Diagnosis present

## 2023-08-22 DIAGNOSIS — M19042 Primary osteoarthritis, left hand: Secondary | ICD-10-CM | POA: Diagnosis present

## 2023-08-22 DIAGNOSIS — E782 Mixed hyperlipidemia: Secondary | ICD-10-CM | POA: Insufficient documentation

## 2023-08-22 DIAGNOSIS — M79672 Pain in left foot: Secondary | ICD-10-CM | POA: Insufficient documentation

## 2023-08-22 DIAGNOSIS — M461 Sacroiliitis, not elsewhere classified: Secondary | ICD-10-CM | POA: Diagnosis present

## 2023-08-22 DIAGNOSIS — I10 Essential (primary) hypertension: Secondary | ICD-10-CM | POA: Insufficient documentation

## 2023-08-22 DIAGNOSIS — F17200 Nicotine dependence, unspecified, uncomplicated: Secondary | ICD-10-CM | POA: Insufficient documentation

## 2023-08-22 DIAGNOSIS — M51369 Other intervertebral disc degeneration, lumbar region without mention of lumbar back pain or lower extremity pain: Secondary | ICD-10-CM | POA: Diagnosis present

## 2023-08-22 DIAGNOSIS — M2550 Pain in unspecified joint: Secondary | ICD-10-CM | POA: Diagnosis present

## 2023-08-22 DIAGNOSIS — Z8379 Family history of other diseases of the digestive system: Secondary | ICD-10-CM | POA: Insufficient documentation

## 2023-08-22 DIAGNOSIS — M19041 Primary osteoarthritis, right hand: Secondary | ICD-10-CM | POA: Insufficient documentation

## 2023-08-22 DIAGNOSIS — M79671 Pain in right foot: Secondary | ICD-10-CM | POA: Insufficient documentation

## 2023-08-22 DIAGNOSIS — K219 Gastro-esophageal reflux disease without esophagitis: Secondary | ICD-10-CM | POA: Diagnosis present

## 2023-08-22 DIAGNOSIS — M79644 Pain in right finger(s): Secondary | ICD-10-CM | POA: Insufficient documentation

## 2023-08-22 DIAGNOSIS — Z79899 Other long term (current) drug therapy: Secondary | ICD-10-CM | POA: Diagnosis present

## 2023-08-22 DIAGNOSIS — L409 Psoriasis, unspecified: Secondary | ICD-10-CM | POA: Insufficient documentation

## 2023-08-22 DIAGNOSIS — K449 Diaphragmatic hernia without obstruction or gangrene: Secondary | ICD-10-CM | POA: Diagnosis present

## 2023-08-22 DIAGNOSIS — M65341 Trigger finger, right ring finger: Secondary | ICD-10-CM | POA: Insufficient documentation

## 2023-08-22 DIAGNOSIS — L4050 Arthropathic psoriasis, unspecified: Secondary | ICD-10-CM | POA: Diagnosis not present

## 2023-08-22 DIAGNOSIS — R7303 Prediabetes: Secondary | ICD-10-CM | POA: Insufficient documentation

## 2023-08-22 MED ORDER — LIDOCAINE HCL 1 % IJ SOLN
0.5000 mL | INTRAMUSCULAR | Status: AC | PRN
  Administered 2023-08-22: .5 mL

## 2023-08-22 MED ORDER — TRIAMCINOLONE ACETONIDE 40 MG/ML IJ SUSP
10.0000 mg | INTRAMUSCULAR | Status: AC | PRN
  Administered 2023-08-22: 10 mg via INTRA_ARTICULAR

## 2023-08-22 NOTE — Progress Notes (Signed)
 Office Visit Note  Patient: Sherry Stein             Date of Birth: 1970/11/25           MRN: [id number]             PCP: Vaughn Lauraine KATHEE DEVONNA Referring: Vaughn Lauraine KATHEE, PA-C Visit Date: 08/22/2023 Occupation: @GUAROCC @  Subjective:  Medication monitoring  History of Present Illness: Sherry Stein is a 53 y.o. female with psoriatic arthritis and psoriasis.  She returns today after her last visit in May 2025.  She states she has been under a lot of stress over the last few months and has been having a flare of psoriatic arthritis with increased pain and swelling.  She has been off Cosentyx  since December 2024 due to insurance coverage.  She continues to take methotrexate  10 tablets weekly along with folic acid  2 mg daily.  He complains of neck and lower back pain.  She also has discomfort in her right shoulder and right thumb.  She has ablation scheduled for her cervical spine and lumbar spine by [facility].  She had right shoulder joint injection by [facility] last week which helped.  She has not had any flares of Achilles tendinitis or plantar fasciitis.  There is no history of uveitis.  She has had few psoriasis patches.  She is not using any topical agents.    Activities of Daily Living:  Patient reports morning stiffness for 2  hours.   Patient Reports nocturnal pain.  Difficulty dressing/grooming: Reports Difficulty climbing stairs: Denies Difficulty getting out of chair: Denies Difficulty using hands for taps, buttons, cutlery, and/or writing: Reports  Review of Systems  Constitutional:  Positive for fatigue.  HENT:  Negative for mouth sores and mouth dryness.   Eyes:  Negative for dryness.  Respiratory:  Negative for shortness of breath.   Cardiovascular:  Negative for chest pain and palpitations.  Gastrointestinal:  Negative for blood in stool, constipation and diarrhea.  Endocrine: Negative for increased urination.   Genitourinary:  Negative for decreased urine output.  Musculoskeletal:  Positive for joint pain, joint pain, joint swelling, myalgias, morning stiffness and myalgias.  Skin:  Positive for rash. Negative for color change and sensitivity to sunlight.  Allergic/Immunologic: Negative for susceptible to infections.  Neurological:  Negative for dizziness and fainting.  Hematological:  Negative for swollen glands.  Psychiatric/Behavioral:  Positive for depressed mood and sleep disturbance. The patient is nervous/anxious.     PMFS History:  Patient Active Problem List   Diagnosis Date Noted   Hyperlipidemia 08/10/2016   GAD (generalized anxiety disorder) 08/04/2016   History of autoimmune disorder 03/20/2016   Open leg wound, right, sequela 03/20/2016   Drug therapy 05/10/2015   Acute frontal sinusitis 05/09/2015   Allergic conjunctivitis 05/09/2015   Amenorrhea 05/09/2015   Benign essential hypertension 05/09/2015   Depression 05/09/2015   Foot arch pain 05/09/2015   Edema 05/09/2015   Hiatal hernia with GERD without esophagitis 05/09/2015   Muscle spasm 05/09/2015   Pain in joint involving multiple sites 05/09/2015   Psoriasis 05/09/2015   Menorrhagia with irregular cycle 04/10/2015   Hammer toe of right foot 04/07/2015   Pain from implanted hardware 04/07/2015    Past Medical History:  Diagnosis Date   Arthritis    Diabetes mellitus without complication (HCC)    Hypertension     Family History  Problem Relation Age of Onset   Hypertension Mother  Diabetes Father    COPD Father    Hypertension Father    Cancer Sister    High Cholesterol Brother    Crohn's disease Son    Past Surgical History:  Procedure Laterality Date   BUNIONECTOMY Bilateral 2007   CARPAL TUNNEL RELEASE Right 07/2019   REPLACEMENT TOTAL KNEE Left 03/2019   REPLACEMENT TOTAL KNEE Right 12/2018   TOE SURGERY Right    great toe x3   TONSILLECTOMY AND ADENOIDECTOMY     TYMPANOSTOMY TUBE PLACEMENT      WISDOM TOOTH EXTRACTION     20s   Social History   Social History Narrative   ** Merged History Encounter **       Immunization History  Administered Date(s) Administered   PFIZER(Purple Top)SARS-COV-2 Vaccination 11/24/2019, 12/15/2019   Pfizer Covid-19 Vaccine Bivalent Booster 83yrs & up 03/01/2021   Pfizer(Comirnaty)Fall Seasonal Vaccine 12 years and older 11/28/2021     Objective: Vital Signs: BP 113/71 (BP Location: Left Arm, Patient Position: Sitting, Cuff Size: Normal)   Pulse 69   Ht 5' 3 (1.6 m)   Wt 219 lb (99.3 kg)   BMI 38.79 kg/m    Physical Exam Vitals and nursing note reviewed.  Constitutional:      Appearance: She is well-developed.  HENT:     Head: Normocephalic and atraumatic.  Eyes:     Conjunctiva/sclera: Conjunctivae normal.  Cardiovascular:     Rate and Rhythm: Normal rate and regular rhythm.     Heart sounds: Normal heart sounds.  Pulmonary:     Effort: Pulmonary effort is normal.     Breath sounds: Normal breath sounds.  Abdominal:     General: Bowel sounds are normal.     Palpations: Abdomen is soft.  Musculoskeletal:     Cervical back: Normal range of motion.  Lymphadenopathy:     Cervical: No cervical adenopathy.  Skin:    General: Skin is warm and dry.     Capillary Refill: Capillary refill takes less than 2 seconds.  Neurological:     Mental Status: She is alert and oriented to person, place, and time.  Psychiatric:        Behavior: Behavior normal.      Musculoskeletal Exam: She has stiffness with range of motion of the cervical spine.  She had limited painful range of motion of the lumbar spine.  Shoulder joints with good range of motion.  Elbow joints with good range of motion with tenderness over the left elbow.  There is no synovitis of the wrist joints.  She has synovial thickening over bilateral 1st, 2nd and 3rd MCP joints.  She has swelling and synovitis over the right first MCP joint.  Hip joints and knee joints with  good range of motion.  Both knee joints were replaced.  She had bilateral hammertoes.  No Achilles tendinitis or plantar fasciitis was noted.  CDAI Exam: CDAI Score: -- Patient Global: --; Provider Global: -- Swollen: --; Tender: -- Joint Exam 08/22/2023   No joint exam has been documented for this visit   There is currently no information documented on the homunculus. Go to the Rheumatology activity and complete the homunculus joint exam.  Investigation: No additional findings.  Imaging: No results found.  Recent Labs: Lab Results  Component Value Date   WBC 9.5 07/04/2023   HGB 14.7 07/04/2023   PLT 275 07/04/2023   NA 138 07/04/2023   K 4.2 07/04/2023   CL 102 07/04/2023   CO2 30  07/04/2023   GLUCOSE 127 (H) 07/04/2023   BUN 18 07/04/2023   CREATININE 0.74 07/04/2023   BILITOT 0.5 07/04/2023   AST 18 07/04/2023   ALT 22 07/04/2023   PROT 6.7 07/04/2023   CALCIUM 9.2 07/04/2023   GFRAA 105 06/13/2020   QFTBGOLDPLUS NEGATIVE 04/04/2023    Speciality Comments: MTX since2004, Humira-2004-2017-quit working, cosyntex 2018x 8 months, restarted May 11, 2020   FU every 3 months  Procedures:  Ultrasound guided injection is preferred based studies that show increased duration, increased effect, greater accuracy, decreased procedural pain, increased response rate, and decreased cost with ultrasound guided versus blind injection.   Verbal informed consent obtained.  Time-out conducted.  Noted no overlying erythema, induration, or other signs of local infection. Ultrasound-guided right first MCP joint: After sterile prep with Betadine, injected 0.5 mL of 1% lidocaine  and 10 mg Kenalog  using a 27-gauge needle, in the MCP joint.   Small Joint Inj: R thumb MCP on 08/22/2023 8:19 AM Indications: pain and joint swelling Details: 27 G needle, ultrasound-guided dorsal approach Medications: 0.5 mL lidocaine  1 %; 10 mg triamcinolone  acetonide 40 MG/ML Aspirate: 0 mL  Risk of  infection, tendon injury, nerve injury, hypopigmentation and dermal atrophy were discussed. Procedure, treatment alternatives, risks and benefits explained, specific risks discussed. Immediately prior to procedure a time out was called to verify the correct patient, procedure, equipment, support staff and site/side marked as required. Patient was prepped and draped in the usual sterile fashion.     Allergies: Patient has no known allergies.   Assessment / Plan:     Visit Diagnoses: Psoriatic arthritis (HCC)-diagnosed 2004 by Dr. Merryl.  Previous treatment methotrexate  and Humira DC'd 2017.  Patient reports having a psoriasis flare since she has been under a lot of stress over the last few months.  She has been off Cosentyx  since December 2024 due to insurance issues.  She was on Cosentyx  for approximately 2-1/2 years.  She states she was not doing as well on the combination of Cosentyx  and methotrexate .  She would like to switch to some other medication.  She had synovitis in multiple joints and ongoing pain and discomfort.  She has been on methotrexate  monotherapy 25 mg p.o. weekly along with folic acid  2 tablets p.o. daily.  Different treatment options and the side effects were discussed.  After different treatment options and side effects were discussed we decided to proceed with Taltz.  A handout was given and consent was taken.  Will apply for Taltz.  Medication counseling:  Baseline Immunosuppressant Therapy Labs TB GOLD    Latest Ref Rng & Units 04/04/2023    2:32 PM  Quantiferon TB Gold  Quantiferon TB Gold Plus NEGATIVE NEGATIVE    Hepatitis Panel    Latest Ref Rng & Units 05/02/2020   11:09 AM  Hepatitis  Hep B Surface Ag NON-REACTI NON-REACTIVE   Hep B IgM NON-REACTI NON-REACTIVE   Hep C Ab NON-REACTI NON-REACTIVE    Does patient have a history of inflammatory bowel disease? No  Counseled patient that Rosalio is a IL-17 inhibitor that works to reduce pain and inflammation  associated with arthritis.  Counseled patient on purpose, proper use, and adverse effects of Taltz. Reviewed the most common adverse effects of infection, inflammatory bowel disease, and allergic reaction. Counseled patient that Rosalio should be held for infection and prior to scheduled surgery.  Counseled patient to avoid live vaccines while on Taltz.  Advised patient to get annual influenza vaccine, pneumococcal vaccine, and  Shingrix as indicated.  Reviewed storage information for Taltz.  Reviewed the importance of regular labs while on Taltz. Standing orders placed and is to return in 1 month and then every 3 months after initiation.  Provided patient with medication education material and answered all questions.  Patient consented to Taltz.  Will upload consent into patient's chart.  Will apply for Taltz through patient's insurance and update when we receive a response.  Advised initial injection must be administered in office.  Patient voiced understanding.    Taltz dose will be: For psoriatic arthritis and plaque psoriasis overlap load of 160 mg then 80 mg on weeks 2,4,6,8,10,12 then 80 mg every 28 days  Prescription will be sent to pharmacy pending lab results and insurance approval.   Psoriasis-she has intermittent breakouts of psoriasis.  She uses topical agents as needed.  High risk medication use-she is currently on methotrexate  10 tablets p.o. weekly along with folic acid  2 tablets p.o. daily.  She has been off Cosentyx  since December 2024.  Patient was on Cosentyx  for 2-1/2 years.  Her previous treatment was methotrexate  and Humira which was also discontinued due to inadequate response.  Pain of right thumb-she is having increased pain and swelling in multiple joints today.  Her right first MCP was swollen and inflamed.  Per patient's request right first MCP joint was injected with lidocaine  and Kenalog  after side effects were discussed as described above.  Patient tolerated procedure well.  A  splint was applied for the next 3 days.  Plan: US  Guided Needle Placement  Primary osteoarthritis of both hands-she had bilateral PIP and DIP thickening.  Joint protection muscle strengthening was discussed.  Trigger finger, right ring finger-improved after the injection given on April 04, 2023.  Status post total knee replacement, bilateral-she had bilateral total knee replacements which have been doing well.  She reports some instability in her left knee joint.  Pain in both feet-she continues to have stiffness in her feet.  Bilateral sacroiliitis (HCC)-she had no tenderness on palpation today.  Degeneration of intervertebral disc of lumbar region without discogenic back pain or lower extremity pain-she continues to have chronic pain pain and neck pain.  She has an appointment with [facility] specialist for possible ablation.  The medical problems are listed as follows:  Family history of Crohn's disease  Hiatal hernia with GERD without esophagitis  Benign essential hypertension  Mixed hyperlipidemia  Prediabetes  GAD (generalized anxiety disorder)  Hypermobility arthralgia  Smoker    Orders: Orders Placed This Encounter  Procedures   Small Joint Inj   US  Guided Needle Placement   No orders of the defined types were placed in this encounter.    Follow-Up Instructions: Return for Psoriatic arthritis.   Maya Nash, MD  Note - This record has been created using Animal nutritionist.  Chart creation errors have been sought, but may not always  have been located. Such creation errors do not reflect on  the standard of medical care.

## 2023-08-22 NOTE — Progress Notes (Signed)
 Pharmacy Note  Subjective:  Patient presents today to [facility] for follow up office visit.  Patient was seen by the pharmacist for counseling on Taltz for psoriatic arthritis..  Prior therapy includes: Methotrexate  (current), Cosentyx  (2018-2024), Humira(2017).  History of inflammatory bowel disease: No  Objective:  CBC    Component Value Date/Time   WBC 9.5 07/04/2023 1458   RBC 4.52 07/04/2023 1458   HGB 14.7 07/04/2023 1458   HCT 44.7 07/04/2023 1458   PLT 275 07/04/2023 1458   MCV 98.9 07/04/2023 1458   MCH 32.5 07/04/2023 1458   MCHC 32.9 07/04/2023 1458   RDW 14.1 07/04/2023 1458   LYMPHSABS 2,495 10/03/2022 1411   EOSABS 333 07/04/2023 1458   BASOSABS 48 07/04/2023 1458    CMP     Component Value Date/Time   NA 138 07/04/2023 1458   K 4.2 07/04/2023 1458   CL 102 07/04/2023 1458   CO2 30 07/04/2023 1458   GLUCOSE 127 (H) 07/04/2023 1458   BUN 18 07/04/2023 1458   CREATININE 0.74 07/04/2023 1458   CALCIUM 9.2 07/04/2023 1458   PROT 6.7 07/04/2023 1458   AST 18 07/04/2023 1458   ALT 22 07/04/2023 1458   BILITOT 0.5 07/04/2023 1458   GFRNONAA 91 06/13/2020 1211   GFRAA 105 06/13/2020 1211    Baseline Immunosuppressant Therapy Labs     Latest Ref Rng & Units 04/04/2023    2:32 PM  Quantiferon TB Gold  Quantiferon TB Gold Plus NEGATIVE NEGATIVE        Latest Ref Rng & Units 05/02/2020   11:09 AM  Hepatitis  Hep B Surface Ag NON-REACTI NON-REACTIVE   Hep B IgM NON-REACTI NON-REACTIVE   Hep C Ab NON-REACTI NON-REACTIVE     Lab Results  Component Value Date   HIV NON-REACTIVE 05/02/2020       Latest Ref Rng & Units 05/02/2020   11:09 AM  Immunoglobulin Electrophoresis  IgA  47 - 310 mg/dL 97   IgG 399 - 8,359 mg/dL 210   IgM 50 - 699 mg/dL 77        Latest Ref Rng & Units 07/04/2023    2:58 PM  Serum Protein Electrophoresis  Total Protein 6.1 - 8.1 g/dL 6.7     No results found for: G6PDH  No results found for:  TPMT  Chest Xray: No active cardiopulmonary process.   Assessment/Plan:  Counseled patient that Rosalio is a IL-17 inhibitor that works to reduce pain and inflammation associated with arthritis.  Counseled patient on purpose, proper use, and adverse effects of Taltz. Reviewed the most common adverse effects of infection (more commonly nasopharyngitis, URTI), inflammatory bowel disease, and allergic reaction. Counseled patient that Rosalio should be held for infection and prior to scheduled surgery.  Counseled patient to avoid live vaccines while on Taltz. Recommend annual influenza, PCV 15 or PCV20 or Pneumovax 23, and Shingrix as indicated. Reviewed storage information for Taltz.  Reviewed the importance of regular labs while on Taltz. Will monitor CBC and CMP 1 month after starting and every 3 months routinely thereafter. Will monitor TB gold annually. Standing orders placed. Provided patient with medication education material and answered all questions.  Patient consented to Taltz.  Will upload consent into patient's chart.  Will apply for Taltz through patient's insurance and update when we receive a response.  Advised initial injection must be administered in office.  Patient voiced understanding.    Taltz dose will be: For psoriatic arthritis and plaque psoriasis  overlap load of 160 mg then 80 mg on weeks 2,4,6,8,10,12 then 80 mg every 28 days  Prescription will be sent to pharmacy pending lab results and insurance approval.

## 2023-08-22 NOTE — Patient Instructions (Addendum)
 Ixekizumab Injection What is this medication? IXEKIZUMAB (ix e KIZ ue mab) treats autoimmune conditions, such as psoriasis and arthritis. It works by slowing down an overactive immune system. It is a monoclonal antibody. This medicine may be used for other purposes; ask your health care provider or pharmacist if you have questions. COMMON BRAND NAME(S): TALTZ What should I tell my care team before I take this medication? They need to know if you have any of these conditions: Immune system problems Infection, such as viral infection, chickenpox, cold sores, or herpes Recently received or are scheduled to receive a vaccine Tuberculosis, a positive skin test for tuberculosis, or recent close contact with someone who has tuberculosis An unusual or allergic reaction to ixekizumab, other medications, foods, dyes, or preservatives Pregnant or trying to get pregnant Breast-feeding How should I use this medication? This medication is injected under the skin. It is usually given by your care team in a hospital or clinic setting. It may also be given at home. If you get this medication at home, you will be taught how to prepare and give it. Use exactly as directed. Take it as directed on the prescription label at the same time every day. Keep taking it unless your care team tells you to stop. It is important that you put your used needles and syringes in a special sharps container. Do not put them in a trash can. If you do not have a sharps container, call your pharmacist or care team to get one. A special MedGuide will be given to you by the pharmacist with each prescription and refill. If you are getting this medication in a hospital or clinic, a special MedGuide will be given to you before each treatment. Be sure to read this information carefully each time. Talk to your care team about the use of this medication in children. While it be prescribed for children as Dechellis as 6 years for selected conditions,  precautions do apply. Overdosage: If you think you have taken too much of this medicine contact a poison control center or emergency room at once. NOTE: This medicine is only for you. Do not share this medicine with others. What if I miss a dose? If you get this medication at the hospital or clinic: It is important not to miss your dose. Call your care team if you are unable to keep an appointment. If you give yourself the medication at home: If you miss a dose, take it as soon as you can. Then continue your normal schedule. Do not take double or extra doses. Call your care team with questions. What may interact with this medication? Do not take this medication with any of the following: Live virus vaccines This medication may also interact with the following: Inactivated vaccines This list may not describe all possible interactions. Give your health care provider a list of all the medicines, herbs, non-prescription drugs, or dietary supplements you use. Also tell them if you smoke, drink alcohol, or use illegal drugs. Some items may interact with your medicine. What should I watch for while using this medication? Visit your care team for regular checks on your progress. Tell your care team if your symptoms do not start to get better or if they get worse. You will be tested for tuberculosis (TB) before you start this medication. If your care team prescribes any medication for TB, you should start taking the TB medication before starting this medication. Make sure to finish the full course of TB  medication. This medication may increase your risk of getting an infection. Call your care team for advice if you get a fever, chills, sore throat, or other symptoms of a cold or flu. Do not treat yourself. Try to avoid being around people who are sick. This medication can decrease the response to a vaccine. If you need to get vaccinated, tell your care team if you have received this medication within the last  6 months. Extra booster doses may be needed. Talk to your care team to see if a different vaccination schedule is needed. What side effects may I notice from receiving this medication? Side effects that you should report to your care team as soon as possible: Allergic reactions--skin rash, itching, hives, swelling of the face, lips, tongue, or throat Dry, itchy, scaly patches of skin that blister or peel Infection--fever, chills, cough, sore throat, wounds that don't heal, pain or trouble when passing urine, general feeling of discomfort or being unwell Sudden or severe stomach pain, bloody diarrhea, fever, nausea, vomiting Side effects that usually do not require medical attention (report these to your care team if they continue or are bothersome): Nausea Pain, redness, or irritation at injection site Runny or stuffy nose Sore throat This list may not describe all possible side effects. Call your doctor for medical advice about side effects. You may report side effects to FDA at 1-800-FDA-1088. Where should I keep my medication? Keep out of the reach of children and pets. Store in the refrigerator. Do not freeze. Do not shake. Keep this medication in the original container. Protect from light. Use the dose within 30 minutes of removing it from the refrigerator. Get rid of any unused medication after the expiration date. To get rid of medications that are no longer needed or have expired: Take the medication to a medication take-back program. Check with your pharmacy or law enforcement to find a location. If you cannot return the medication, ask your pharmacist or care team how to get rid of this medication safely. NOTE: This sheet is a summary. It may not cover all possible information. If you have questions about this medicine, talk to your doctor, pharmacist, or health care provider.  2024 Elsevier/Gold Standard (2023-01-04 00:00:00)  Standing Labs We placed an order today for your  standing lab work.   Please have your standing labs drawn in 1 month after starting Taltz and then every 3 months  Please have your labs drawn 2 weeks prior to your appointment so that the provider can discuss your lab results at your appointment, if possible.  Please note that you may see your imaging and lab results in MyChart before we have reviewed them. We will contact you once all results are reviewed. Please allow our office up to 72 hours to thoroughly review all of the results before contacting the office for clarification of your results.  WALK-IN LAB HOURS  Monday through Thursday from 8:00 am -12:30 pm and 1:00 pm-4:30 pm and Friday from 8:00 am-12:00 pm.  Patients with office visits requiring labs will be seen before walk-in labs.  You may encounter longer than normal wait times. Please allow additional time. Wait times may be shorter on  Monday and Thursday afternoons.  We do not book appointments for walk-in labs. We appreciate your patience and understanding with our staff.   Labs are drawn by [facility]. Please bring your co-pay at the time of your lab draw.  You may receive a bill from [facility] for your lab  work.  Please note if you are on Hydroxychloroquine and and an order has been placed for a Hydroxychloroquine level,  you will need to have it drawn 4 hours or more after your last dose.  If you wish to have your labs drawn at another location, please call the office 24 hours in advance so we can fax the orders.  The office is located at [address], [place], [place] [address]   If you have any questions regarding directions or hours of operation,  please call [phone].   As a reminder, please drink plenty of water prior to coming for your lab work. Thanks!   Vaccines You are taking a medication(s) that can suppress your immune system.  The following immunizations are recommended: Flu annually Covid-19  Td/Tdap (tetanus, diphtheria, pertussis) every 10  years Pneumonia (Prevnar 15 then Pneumovax 23 at least 1 year apart.  Alternatively, can take Prevnar 20 without needing additional dose) Shingrix: 2 doses from 4 weeks to 6 months apart  Please check with your PCP to make sure you are up to date.   If you have signs or symptoms of an infection or start antibiotics: First, call your PCP for workup of your infection. Hold your medication through the infection, until you complete your antibiotics, and until symptoms resolve if you take the following: Injectable medication (Actemra, Benlysta, Cimzia, Cosentyx , Enbrel, Humira, Kevzara, Orencia, Remicade, Simponi, Stelara, Taltz, Tremfya) Methotrexate  Leflunomide (Arava) Mycophenolate (Cellcept) Xeljanz, Olumiant, or Rinvoq

## 2023-08-22 NOTE — Telephone Encounter (Signed)
 Submitted Patient Assistance Application to [facility] for TALTZ along with provider portion, patient portion, medication list, insurance card copy (A/B only). Will update patient when we receive a response.  Phone: [phone] Fax: [phone]  Sherry Pennant, PharmD, MPH, BCPS, CPP Clinical Pharmacist (Rheumatology and Pulmonology)

## 2023-08-23 NOTE — Telephone Encounter (Signed)
 Received a fax from  [facility] regarding an approval for TALTZ patient assistance from 08/22/2023 to 02/05/2024. Approval letter sent to scan center.  Phone: [phone] Fax: [phone]  Patient can be scheduled for Taltz new start. Will plan to use sample  English Tomer, PharmD, MPH, BCPS, CPP Clinical Pharmacist (Rheumatology and Pulmonology)

## 2023-08-26 NOTE — Telephone Encounter (Signed)
 Called patient regarding Rosalio new start appointment. Patient denies infection or any recent antibiotic use. Patient has a neck ablation  08/29/2023. Will schedule for the following week. Appointment scheduled Monday 09/02/2023 at 9:30am.   Deleta Colt PharmD Candidate 2026  [facility]

## 2023-08-29 NOTE — Progress Notes (Signed)
 Pharmacy Note  Subjective:   Patient presents to clinic today to receive first dose of Taltz  for 09/02/2023. Patient currently takes methotrexate  25mg  p.o. weekly along with folic acid  2 tablets p.o. daily. .  Patient running a fever or have signs/symptoms of infection? No  Patient currently on antibiotics for the treatment of infection? No  Patient have any upcoming invasive procedures/surgeries? No  Objective: CMP     Component Value Date/Time   NA 138 07/04/2023 1458   K 4.2 07/04/2023 1458   CL 102 07/04/2023 1458   CO2 30 07/04/2023 1458   GLUCOSE 127 (H) 07/04/2023 1458   BUN 18 07/04/2023 1458   CREATININE 0.74 07/04/2023 1458   CALCIUM 9.2 07/04/2023 1458   PROT 6.7 07/04/2023 1458   AST 18 07/04/2023 1458   ALT 22 07/04/2023 1458   BILITOT 0.5 07/04/2023 1458   GFRNONAA 91 06/13/2020 1211   GFRAA 105 06/13/2020 1211    CBC    Component Value Date/Time   WBC 9.5 07/04/2023 1458   RBC 4.52 07/04/2023 1458   HGB 14.7 07/04/2023 1458   HCT 44.7 07/04/2023 1458   PLT 275 07/04/2023 1458   MCV 98.9 07/04/2023 1458   MCH 32.5 07/04/2023 1458   MCHC 32.9 07/04/2023 1458   RDW 14.1 07/04/2023 1458   LYMPHSABS 2,495 10/03/2022 1411   EOSABS 333 07/04/2023 1458   BASOSABS 48 07/04/2023 1458    Baseline Immunosuppressant Therapy Labs TB GOLD    Latest Ref Rng & Units 04/04/2023    2:32 PM  Quantiferon TB Gold  Quantiferon TB Gold Plus NEGATIVE NEGATIVE    Hepatitis Panel    Latest Ref Rng & Units 05/02/2020   11:09 AM  Hepatitis  Hep B Surface Ag NON-REACTI NON-REACTIVE   Hep B IgM NON-REACTI NON-REACTIVE   Hep C Ab NON-REACTI NON-REACTIVE    HIV Lab Results  Component Value Date   HIV NON-REACTIVE 05/02/2020   Immunoglobulins    Latest Ref Rng & Units 05/02/2020   11:09 AM  Immunoglobulin Electrophoresis  IgA  47 - 310 mg/dL 97   IgG 399 - 8,359 mg/dL 210   IgM 50 - 699 mg/dL 77    SPEP    Latest Ref Rng & Units 07/04/2023    2:58 PM  Serum  Protein Electrophoresis  Total Protein 6.1 - 8.1 g/dL 6.7    H3EI No results found for: G6PDH TPMT No results found for: TPMT   Chest x-ray (05/02/2020): No active cardiopulmonary process.   Assessment/Plan:  Reviewed importance of holding Taltz  with signs/symptoms of an infections, if antibiotics are prescribed to treat an active infection, and with invasive procedures  Demonstrated proper injection technique with Taltz  demo device  Patient able to demonstrate proper injection technique using the teach back method.  Patient self injected in the right upper thigh and left upper thigh with:  Sample Medication: Taltz  autoinjector pen 80mg /mL x 2  Lot: [id number] Expiration: 08/20/2023  Patient tolerated well.  Observed for 30 mins in office for adverse reaction. Patient denies itchiness and irritation at injection., No swelling or redness noted., and Reviewed injection site reaction management with patient verbally and printed information for review in AVS  Patient is to return in 1 month for labs and 6-8 weeks for follow-up appointment.  Standing orders for CBC/CMP.  TB gold will be monitored yearly.  Taltz  approved through patient assistance . Rx sent to: [facility] for [facility]/Taltz : [phone].  Patient provided with pharmacy phone number and advised  to call later this week to schedule shipment to home.  Patient will continue Taltz  160 mg (administered in office today) then 80 mg on weeks 2,4,6,8,10,12 then 80 mg every 28 days  in combination with methotrexate  25mg  p.o. weekly along with folic acid  2 tablets p.o. daily.  All questions encouraged and answered.  Instructed patient to call with any further questions or concerns.  Sherry Pennant, PharmD, MPH, BCPS, CPP Clinical Pharmacist (Rheumatology and Pulmonology)  08/29/2023 10:16 AM

## 2023-09-02 ENCOUNTER — Ambulatory Visit: Attending: Rheumatology | Admitting: Pharmacist

## 2023-09-02 DIAGNOSIS — L4050 Arthropathic psoriasis, unspecified: Secondary | ICD-10-CM

## 2023-09-02 DIAGNOSIS — L409 Psoriasis, unspecified: Secondary | ICD-10-CM

## 2023-09-02 DIAGNOSIS — Z79899 Other long term (current) drug therapy: Secondary | ICD-10-CM

## 2023-09-02 DIAGNOSIS — Z7189 Other specified counseling: Secondary | ICD-10-CM

## 2023-09-02 MED ORDER — TALTZ 80 MG/ML ~~LOC~~ SOAJ: 80.0000 mg | SUBCUTANEOUS | 0 refills | Status: DC

## 2023-09-02 MED ORDER — TALTZ 80 MG/ML ~~LOC~~ SOAJ: SUBCUTANEOUS | 0 refills | Status: DC

## 2023-09-02 MED ORDER — TALTZ 80 MG/ML ~~LOC~~ SOAJ: SUBCUTANEOUS | 1 refills | Status: DC

## 2023-09-02 NOTE — Patient Instructions (Addendum)
 Your next TALTZ  dose is due on 09/16/2023, 09/30/2023, 10/14/2023, 10/28/2023, 11/11/2023, 11/25/2023, 12/23/2023 and every 28 days thereafter  CONTINUE methotrexate  25mg  tablets once weekly. Take with folic acid  2mg  tablet by mouth once a day.  HOLD TALTZ  if you have signs or symptoms of an infection. You can resume once you feel better or back to your baseline. HOLD TALTZ  if you start antibiotics to treat an infection. HOLD TALTZ  around the time of surgery/procedures. Your surgeon will be able to provide recommendations on when to hold BEFORE and when you are cleared to RESUME.  Pharmacy information: Your prescription will be shipped from [facility] for Taltz . Their phone number is [phone] Please call to schedule shipment and confirm address. They will mail your medication to your home.  Labs are due in 1 month then every 3 months. Lab hours are from Monday to Thursday 8am-12:30pm and 1pm-4pm and Friday 8am-12pm. You do not need an appointment if you come for labs during these times. If you'd like to go to a [facility] or [facility] closer to home, please call our clinic 48 hours prior to lab date so we can release orders in a timely manner.  Stay up to date on all routine vaccines: influenza, pneumonia, COVID19, Shingles  How to manage an injection site reaction: Remember the 5 C's: COUNTER - leave on the counter at least 30 minutes but up to overnight to bring medication to room temperature. This may help prevent stinging COLD - place something cold (like an ice gel pack or cold water bottle) on the injection site just before cleansing with alcohol. This may help reduce pain CLARITIN - use Claritin (generic name is loratadine) for the first two weeks of treatment or the day of, the day before, and the day after injecting. This will help to minimize injection site reactions CORTISONE CREAM - apply if injection site is irritated and itching CALL ME - if injection site reaction is bigger than  the size of your fist, looks infected, blisters, or if you develop hives

## 2023-09-09 ENCOUNTER — Telehealth: Payer: Self-pay

## 2023-09-09 NOTE — Telephone Encounter (Signed)
 Patient contacted the office regarding her Taltz . Patient states she called [facility] to get her medication and was advised they are waiting for information from our office. Patient states she was told they have the loading dose prescription but not the maintenance prescription and will not send anything out without having both prescriptions. Patient requests a call back once everything is figured out with the pharmacy. Please advise.

## 2023-09-10 NOTE — Telephone Encounter (Signed)
 Called [facility] and spoke with pharmacy rep - they state that they were told by patient she is due for next dose on Monday which is why they needed clarification. Advised that we will send maintenance rx once she follows up in office per our policy.  Spoke with patient and provided her with pharmacy phone below to schedule shipment for expedited request  Pharmacy phone: [phone]

## 2023-09-23 ENCOUNTER — Other Ambulatory Visit: Payer: Self-pay | Admitting: Physician Assistant

## 2023-09-23 NOTE — Telephone Encounter (Signed)
 Last Fill: 06/25/2023  Labs: 07/04/2023 Glucose is 127. Rest of CMP WNL.  CBC WNL   Next Visit: 10/14/2023  Patient to update labs at this appointment  Last Visit: 08/22/2023  DX: Psoriatic arthritis   Current Dose per office note 08/22/2023: methotrexate  10 tablets p.o. weekly   Okay to refill Methotrexate ?

## 2023-09-30 NOTE — Progress Notes (Unsigned)
 Office Visit Note  Patient: Sherry Stein             Date of Birth: [birth year]           MRN: [id number]             PCP: Vaughn Lauraine KATHEE DEVONNA Referring: Vaughn Lauraine KATHEE, PA-C Visit Date: 10/14/2023 Occupation: @GUAROCC @  Subjective:  Medication monitoring   History of Present Illness: Sherry Stein is a 53 y.o. female with history of psoriatic arthritis.  Patient was started on taltz  on 09/02/23.  She is tolerating Taltz  without any side effects or injection site reactions.  She has started to notice clinical benefit over the past 2 to 3 weeks.  Her psoriasis remains cleared.  She has noticed less pain and inflammation in the right thumb.  She is also been experiencing less discomfort and stiffness in her lower back.  Of note the patient underwent an ablation in the cervical spine at the end of July 2025 and an ablation for her lower back in early August 2025 which has also been helpful.  She denies any Achilles tendinitis or plantar fasciitis. Patient remains under [facility].  She had a recent evaluation by Dr. Georgina due to experiencing increased hyperextension and instability in the right knee replacement.  Patient reports that she will require a right knee revision but she is planning for November/December 2025.  Patient is concerned about the level of hypermobility she has and would like to seek further evaluation.   Activities of Daily Living:  Patient reports morning stiffness for 1.5-2 hours.   Patient Denies nocturnal pain.  Difficulty dressing/grooming: Denies Difficulty climbing stairs: Denies Difficulty getting out of chair: Denies Difficulty using hands for taps, buttons, cutlery, and/or writing: Reports  Review of Systems  Constitutional:  Positive for fatigue.  HENT:  Negative for mouth sores and mouth dryness.   Eyes:  Negative for dryness.  Respiratory:  Negative for shortness of breath.   Cardiovascular:  Negative for chest pain and  palpitations.  Gastrointestinal:  Negative for blood in stool, constipation and diarrhea.  Endocrine: Negative for increased urination.  Genitourinary:  Negative for involuntary urination.  Musculoskeletal:  Positive for joint pain, joint pain, joint swelling, myalgias, morning stiffness, muscle tenderness and myalgias. Negative for gait problem and muscle weakness.  Skin:  Negative for color change, rash, hair loss and sensitivity to sunlight.  Allergic/Immunologic: Negative for susceptible to infections.  Neurological:  Negative for dizziness and headaches.  Hematological:  Negative for swollen glands.  Psychiatric/Behavioral:  Negative for depressed mood and sleep disturbance. The patient is not nervous/anxious.     PMFS History:  Patient Active Problem List   Diagnosis Date Noted   Hyperlipidemia 08/10/2016   GAD (generalized anxiety disorder) 08/04/2016   History of autoimmune disorder 03/20/2016   Open leg wound, right, sequela 03/20/2016   Drug therapy 05/10/2015   Acute frontal sinusitis 05/09/2015   Allergic conjunctivitis 05/09/2015   Amenorrhea 05/09/2015   Benign essential hypertension 05/09/2015   Depression 05/09/2015   Foot arch pain 05/09/2015   Edema 05/09/2015   Hiatal hernia with GERD without esophagitis 05/09/2015   Muscle spasm 05/09/2015   Pain in joint involving multiple sites 05/09/2015   Psoriasis 05/09/2015   Menorrhagia with irregular cycle 04/10/2015   Hammer toe of right foot 04/07/2015   Pain from implanted hardware 04/07/2015    Past Medical History:  Diagnosis Date   Arthritis    Diabetes mellitus without complication (  HCC)    Hypertension     Family History  Problem Relation Age of Onset   Hypertension Mother    Diabetes Father    COPD Father    Hypertension Father    Cancer Sister    High Cholesterol Brother    Crohn's disease Son    Past Surgical History:  Procedure Laterality Date   BUNIONECTOMY Bilateral 2007   CARPAL TUNNEL  RELEASE Right 07/2019   REPLACEMENT TOTAL KNEE Left 03/2019   REPLACEMENT TOTAL KNEE Right 12/2018   TOE SURGERY Right    great toe x3   TONSILLECTOMY AND ADENOIDECTOMY     TYMPANOSTOMY TUBE PLACEMENT     WISDOM TOOTH EXTRACTION     20s   Social History   Social History Narrative   ** Merged History Encounter **       Immunization History  Administered Date(s) Administered   PFIZER(Purple Top)SARS-COV-2 Vaccination 11/24/2019, 12/15/2019   Pfizer Covid-19 Vaccine Bivalent Booster 46yrs & up 03/01/2021   Pfizer(Comirnaty)Fall Seasonal Vaccine 12 years and older 11/28/2021     Objective: Vital Signs: BP 125/79 (BP Location: Left Arm, Patient Position: Sitting, Cuff Size: Large)   Pulse 66   Temp 98.7 F (37.1 C)   Resp 14   Ht 5' 3 (1.6 m)   Wt 226 lb 9.6 oz (102.8 kg)   BMI 40.14 kg/m    Physical Exam Vitals and nursing note reviewed.  Constitutional:      Appearance: She is well-developed.  HENT:     Head: Normocephalic and atraumatic.  Eyes:     Conjunctiva/sclera: Conjunctivae normal.  Cardiovascular:     Rate and Rhythm: Normal rate and regular rhythm.     Heart sounds: Normal heart sounds.  Pulmonary:     Effort: Pulmonary effort is normal.     Breath sounds: Normal breath sounds.  Abdominal:     General: Bowel sounds are normal.     Palpations: Abdomen is soft.  Musculoskeletal:     Cervical back: Normal range of motion.  Lymphadenopathy:     Cervical: No cervical adenopathy.  Skin:    General: Skin is warm and dry.     Capillary Refill: Capillary refill takes less than 2 seconds.  Neurological:     Mental Status: She is alert and oriented to person, place, and time.  Psychiatric:        Behavior: Behavior normal.      Musculoskeletal Exam: Hypermobility noted.  C-spine has slightly limited range of motion bilateral rotation.  Slightly limited mobility of the lumbar spine.  Shoulder joints have good range of motion with no discomfort currently.   No tenderness along the elbow joint line.  No tenderness or synovitis of the wrist joints.  Thickening of bilateral 1st, 2nd, and 3rd MCP joints.  No tenderness or synovitis at the right first MCP joint.  PIP and DIP thickening noted.  Subluxation of both thumbs.  Hip joints have good range of motion with no groin pain.  Hyperextension of the right knee replacement noted.  Left knee joint replacement has good range of motion.  Ankle joints have good range of motion without tenderness or synovitis.  Hammertoes noted.  No evidence of Achilles tendinitis or plantar fasciitis.  CDAI Exam: CDAI Score: -- Patient Global: --; Provider Global: -- Swollen: --; Tender: -- Joint Exam 10/14/2023   No joint exam has been documented for this visit   There is currently no information documented on the homunculus. Go to  the Rheumatology activity and complete the homunculus joint exam.  Investigation: No additional findings.  Imaging: No results found.  Recent Labs: Lab Results  Component Value Date   WBC 9.5 07/04/2023   HGB 14.7 07/04/2023   PLT 275 07/04/2023   NA 138 07/04/2023   K 4.2 07/04/2023   CL 102 07/04/2023   CO2 30 07/04/2023   GLUCOSE 127 (H) 07/04/2023   BUN 18 07/04/2023   CREATININE 0.74 07/04/2023   BILITOT 0.5 07/04/2023   AST 18 07/04/2023   ALT 22 07/04/2023   PROT 6.7 07/04/2023   CALCIUM 9.2 07/04/2023   GFRAA 105 06/13/2020   QFTBGOLDPLUS NEGATIVE 04/04/2023    Speciality Comments: MTX since2004, Humira-2004-2017-quit working, cosyntex 2018x 8 months, restarted May 11, 2020-12/24   FU every 3 months  Procedures:  No procedures performed Allergies: Patient has no known allergies.     Assessment / Plan:     Visit Diagnoses: Psoriatic arthritis (HCC) --diagnosed 2004 by Dr. Merryl.  Previous treatment methotrexate  and Humira DC'd 2017: She has started to notice clinical benefit since initiating Taltz  on 09/02/2023.  She is tolerating Taltz  without any side  effects or injection site reactions.  She has not had any recent or recurrent infections.  The tenderness and synovitis in her right thumb has resolved since initiating Taltz .  She is also been experiencing less pain and stiffness in her lower back.  No evidence of Achilles tendinitis or plantar fasciitis.  No active psoriasis at this time.  Her primary concern remains her level of hypermobility.  A referral to Dr. Claudene will be placed for further evaluation and management.  She will remain on Taltz  and methotrexate  as combination therapy.  She is vies notify us  if she develops any signs or symptoms of a flare.  She will follow-up in the office in 3 months or sooner if needed.  Psoriasis: No active psoriasis.  High risk medication use -Taltz  80 mg sq injections every 28 days, methotrexate  10 tablets by mouth once weekly along with folic acid  2 tablets by mouth daily.   Taltz  was started on 09/02/23. She has been off Cosentyx  since December 2024. CBC and CMP updated on 07/04/23. CMP updated on 08/28/23.  Order for CBC released today.  TB gold negative on 04/04/23 No recent or recurrent infections.  Discussed the importance of holding taltz  if she develops signs or symptoms of an infection and to resume once the infection has completley cleared.  - Plan: CBC with Differential/Platelet  Pain of right thumb: Subluxation noted.  Patient had an ultrasound-guided right first MCP joint cortisone injection on 08/22/2023.  No tenderness or synovitis noted on exam.  Primary osteoarthritis of both hands: Patient has PIP and DIP thickening consistent with osteoarthritis of both hands.  Thickening of bilateral 1st, 2nd, and 3rd MCP joints.  No active synovitis or dactylitis noted.  Trigger finger, right ring finger - Improved after the injection given on 04/04/2023.  Status post total knee replacement, bilateral: Hyperextension and instability of the right knee replacement--she is planning to undergo a revision by Dr.  Georgina in late fall 2025.  Pain in both feet: No active inflammation noted. Difficulty walking or standing for prolonged periods of time.  She is wearing proper fitting shoes.  Bilateral sacroiliitis [facility]): No SI joint tenderness.    Degeneration of intervertebral disc of lumbar region without discogenic back pain or lower extremity pain  Hypermobility arthralgia: Plan to place referral to Dr. Claudene for further evaluation.  Other medical conditions are listed as follows:   Family history of Crohn's disease  Hiatal hernia with GERD without esophagitis  Mixed hyperlipidemia: Patient is taking crestor as prescribed.   Benign essential hypertension: BP was 125/79 today in the office.   Prediabetes  GAD (generalized anxiety disorder)  Smoker  Orders: Orders Placed This Encounter  Procedures   CBC with Differential/Platelet   No orders of the defined types were placed in this encounter.   Follow-Up Instructions: Return in about 3 months (around 01/13/2024) for Psoriatic arthritis.   Waddell CHRISTELLA Craze, PA-C  Note - This record has been created using Dragon software.  Chart creation errors have been sought, but may not always  have been located. Such creation errors do not reflect on  the standard of medical care.

## 2023-10-14 ENCOUNTER — Ambulatory Visit: Attending: Physician Assistant | Admitting: Physician Assistant

## 2023-10-14 ENCOUNTER — Encounter: Payer: Self-pay | Admitting: Physician Assistant

## 2023-10-14 VITALS — BP 125/79 | HR 66 | Temp 98.7°F | Resp 14 | Ht 63.0 in | Wt 226.6 lb

## 2023-10-14 DIAGNOSIS — K219 Gastro-esophageal reflux disease without esophagitis: Secondary | ICD-10-CM

## 2023-10-14 DIAGNOSIS — Z8379 Family history of other diseases of the digestive system: Secondary | ICD-10-CM | POA: Diagnosis present

## 2023-10-14 DIAGNOSIS — M19042 Primary osteoarthritis, left hand: Secondary | ICD-10-CM

## 2023-10-14 DIAGNOSIS — K449 Diaphragmatic hernia without obstruction or gangrene: Secondary | ICD-10-CM | POA: Diagnosis present

## 2023-10-14 DIAGNOSIS — M79672 Pain in left foot: Secondary | ICD-10-CM | POA: Diagnosis present

## 2023-10-14 DIAGNOSIS — M79671 Pain in right foot: Secondary | ICD-10-CM | POA: Diagnosis present

## 2023-10-14 DIAGNOSIS — Z79899 Other long term (current) drug therapy: Secondary | ICD-10-CM

## 2023-10-14 DIAGNOSIS — L4050 Arthropathic psoriasis, unspecified: Secondary | ICD-10-CM

## 2023-10-14 DIAGNOSIS — M79644 Pain in right finger(s): Secondary | ICD-10-CM

## 2023-10-14 DIAGNOSIS — L409 Psoriasis, unspecified: Secondary | ICD-10-CM | POA: Diagnosis present

## 2023-10-14 DIAGNOSIS — M19041 Primary osteoarthritis, right hand: Secondary | ICD-10-CM

## 2023-10-14 DIAGNOSIS — M461 Sacroiliitis, not elsewhere classified: Secondary | ICD-10-CM

## 2023-10-14 DIAGNOSIS — F411 Generalized anxiety disorder: Secondary | ICD-10-CM | POA: Diagnosis present

## 2023-10-14 DIAGNOSIS — I10 Essential (primary) hypertension: Secondary | ICD-10-CM

## 2023-10-14 DIAGNOSIS — Z96653 Presence of artificial knee joint, bilateral: Secondary | ICD-10-CM

## 2023-10-14 DIAGNOSIS — F17200 Nicotine dependence, unspecified, uncomplicated: Secondary | ICD-10-CM | POA: Diagnosis present

## 2023-10-14 DIAGNOSIS — R7303 Prediabetes: Secondary | ICD-10-CM

## 2023-10-14 DIAGNOSIS — M2550 Pain in unspecified joint: Secondary | ICD-10-CM

## 2023-10-14 DIAGNOSIS — E782 Mixed hyperlipidemia: Secondary | ICD-10-CM

## 2023-10-14 DIAGNOSIS — M51369 Other intervertebral disc degeneration, lumbar region without mention of lumbar back pain or lower extremity pain: Secondary | ICD-10-CM

## 2023-10-14 DIAGNOSIS — M65341 Trigger finger, right ring finger: Secondary | ICD-10-CM | POA: Diagnosis present

## 2023-10-14 LAB — CBC WITH DIFFERENTIAL/PLATELET
Absolute Lymphocytes: 2508 {cells}/uL (ref 850–3900)
Absolute Monocytes: 581 {cells}/uL (ref 200–950)
Basophils Absolute: 44 {cells}/uL (ref 0–200)
Basophils Relative: 0.5 %
Eosinophils Absolute: 273 {cells}/uL (ref 15–500)
Eosinophils Relative: 3.1 %
HCT: 45.2 % — ABNORMAL HIGH (ref 35.0–45.0)
Hemoglobin: 15 g/dL (ref 11.7–15.5)
MCH: 32.8 pg (ref 27.0–33.0)
MCHC: 33.2 g/dL (ref 32.0–36.0)
MCV: 98.9 fL (ref 80.0–100.0)
MPV: 10.4 fL (ref 7.5–12.5)
Monocytes Relative: 6.6 %
Neutro Abs: 5394 {cells}/uL (ref 1500–7800)
Neutrophils Relative %: 61.3 %
Platelets: 263 Thousand/uL (ref 140–400)
RBC: 4.57 Million/uL (ref 3.80–5.10)
RDW: 13.1 % (ref 11.0–15.0)
Total Lymphocyte: 28.5 %
WBC: 8.8 Thousand/uL (ref 3.8–10.8)

## 2023-10-15 ENCOUNTER — Ambulatory Visit: Payer: Self-pay | Admitting: Physician Assistant

## 2023-10-15 NOTE — Addendum Note (Signed)
 Addended by: YVONE DAVED BROCKS on: 10/15/2023 08:11 AM   Modules accepted: Orders

## 2023-10-15 NOTE — Progress Notes (Signed)
Hematocrit is borderline elevated. Rest of CBC WNL.

## 2023-10-23 ENCOUNTER — Other Ambulatory Visit: Payer: Self-pay | Admitting: Rheumatology

## 2023-10-30 ENCOUNTER — Other Ambulatory Visit: Payer: Self-pay | Admitting: *Deleted

## 2023-10-30 DIAGNOSIS — Z79899 Other long term (current) drug therapy: Secondary | ICD-10-CM

## 2023-10-30 DIAGNOSIS — L4050 Arthropathic psoriasis, unspecified: Secondary | ICD-10-CM

## 2023-10-30 DIAGNOSIS — L409 Psoriasis, unspecified: Secondary | ICD-10-CM

## 2023-10-30 MED ORDER — TALTZ 80 MG/ML ~~LOC~~ SOAJ
SUBCUTANEOUS | 0 refills | Status: DC
Start: 2023-10-30 — End: 2023-12-23

## 2023-10-30 NOTE — Telephone Encounter (Signed)
 Patient contacted the office and states she needs a refill of her Taltz  sent to the [facility].   Last Fill: 09/02/2023  Labs: 10/14/2023 Hematocrit is borderline elevated. Rest of CBC WNL  Per office note CMP updated on 08/28/23   TB Gold: 04/04/2023 Neg    Next Visit: 01/13/2024  Last Visit: 10/14/2023  IK:Ednmpjupr arthritis   Current Dose per office note 09/02/2023: Taltz  80 mg on weeks 2,4,6,8,10,12 then 80 mg every 28 days    Okay to refill Taltz ?

## 2023-11-05 ENCOUNTER — Ambulatory Visit: Admitting: Family Medicine

## 2023-11-05 ENCOUNTER — Ambulatory Visit: Payer: Self-pay | Admitting: Family Medicine

## 2023-11-05 VITALS — BP 124/84 | HR 72 | Ht 63.0 in | Wt 223.0 lb

## 2023-11-05 DIAGNOSIS — M357 Hypermobility syndrome: Secondary | ICD-10-CM

## 2023-11-05 DIAGNOSIS — Z6839 Body mass index (BMI) 39.0-39.9, adult: Secondary | ICD-10-CM

## 2023-11-05 DIAGNOSIS — M2550 Pain in unspecified joint: Secondary | ICD-10-CM

## 2023-11-05 DIAGNOSIS — D509 Iron deficiency anemia, unspecified: Secondary | ICD-10-CM | POA: Diagnosis not present

## 2023-11-05 DIAGNOSIS — R600 Localized edema: Secondary | ICD-10-CM

## 2023-11-05 DIAGNOSIS — R5383 Other fatigue: Secondary | ICD-10-CM

## 2023-11-05 DIAGNOSIS — R609 Edema, unspecified: Secondary | ICD-10-CM

## 2023-11-05 LAB — COMPREHENSIVE METABOLIC PANEL WITH GFR
ALT: 27 U/L (ref 0–35)
AST: 23 U/L (ref 0–37)
Albumin: 4.5 g/dL (ref 3.5–5.2)
Alkaline Phosphatase: 55 U/L (ref 39–117)
BUN: 19 mg/dL (ref 6–23)
CO2: 29 meq/L (ref 19–32)
Calcium: 9 mg/dL (ref 8.4–10.5)
Chloride: 101 meq/L (ref 96–112)
Creatinine, Ser: 0.73 mg/dL (ref 0.40–1.20)
GFR: 92.92 mL/min (ref >60.00)
Glucose, Bld: 123 mg/dL — ABNORMAL HIGH (ref 70–99)
Potassium: 3.6 meq/L (ref 3.5–5.1)
Sodium: 139 meq/L (ref 135–145)
Total Bilirubin: 0.5 mg/dL (ref 0.2–1.2)
Total Protein: 6.9 g/dL (ref 6.0–8.3)

## 2023-11-05 LAB — CBC WITH DIFFERENTIAL/PLATELET
Basophils Absolute: 0 K/uL (ref 0.0–0.1)
Basophils Relative: 0.6 % (ref 0.0–3.0)
Eosinophils Absolute: 0.3 K/uL (ref 0.0–0.7)
Eosinophils Relative: 4.4 % (ref 0.0–5.0)
HCT: 44.5 % (ref 36.0–46.0)
Hemoglobin: 15 g/dL (ref 12.0–15.0)
Lymphocytes Relative: 31 % (ref 12.0–46.0)
Lymphs Abs: 2.3 K/uL (ref 0.7–4.0)
MCHC: 33.7 g/dL (ref 30.0–36.0)
MCV: 97.5 fl (ref 78.0–100.0)
Monocytes Absolute: 0.4 K/uL (ref 0.1–1.0)
Monocytes Relative: 5.3 % (ref 3.0–12.0)
Neutro Abs: 4.4 K/uL (ref 1.4–7.7)
Neutrophils Relative %: 58.7 % (ref 43.0–77.0)
Platelets: 248 K/uL (ref 150.0–400.0)
RBC: 4.56 Mil/uL (ref 3.87–5.11)
RDW: 13.9 % (ref 11.5–15.5)
WBC: 7.4 K/uL (ref 4.0–10.5)

## 2023-11-05 LAB — URIC ACID: Uric Acid, Serum: 5.8 mg/dL (ref 2.4–7.0)

## 2023-11-05 LAB — C-REACTIVE PROTEIN: CRP: <0.5 mg/dL (ref 0.5–20.0)

## 2023-11-05 LAB — VITAMIN B12: Vitamin B-12: 750 pg/mL (ref 211–911)

## 2023-11-05 LAB — IBC PANEL
Iron: 84 ug/dL (ref 42–145)
Saturation Ratios: 26.3 % (ref 20.0–50.0)
TIBC: 319.2 ug/dL (ref 250.0–450.0)
Transferrin: 228 mg/dL (ref 212.0–360.0)

## 2023-11-05 LAB — FERRITIN: Ferritin: 81.2 ng/mL (ref 10.0–291.0)

## 2023-11-05 LAB — SEDIMENTATION RATE: Sed Rate: 10 mm/h (ref 0–30)

## 2023-11-05 LAB — TSH: TSH: 1.16 u[IU]/mL (ref 0.35–5.50)

## 2023-11-05 LAB — VITAMIN D 25 HYDROXY (VIT D DEFICIENCY, FRACTURES): VITD: 36.02 ng/mL (ref 30.00–100.00)

## 2023-11-05 NOTE — Patient Instructions (Signed)
 1000mg   Vit C Labs today Echo See me in 3 months

## 2023-11-05 NOTE — Progress Notes (Signed)
 [facility] [address] [place] [address] Phone: [phone] Subjective:   Sherry Stein Sherry Stein, am serving as a scribe for Dr. Arthea Claudene.  I'm seeing this patient by the request  of:  Emilio Joesph DEL, PA-C  CC: psoriatic arthritis, hypermobility and pain  YEP:Dlagzrupcz  Sherry Stein is a 53 y.o. female coming in with complaint of multiple different joint difficulties.  Has had multiple different surgeries including bilateral total knee replacement.  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  Patient is undergoing radiofrequency ablation previously at [facility]. 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 Hx of a lot of ankle sprains. Also said she has hx of psoriatic arthritis. Going to have revision of R TKR by Dr. Georgina with [facility]. Uses flexeril at bedtime. Also uses massage and hot water soaks for pain relief. Wants to know what other systems are effected by EDS and look into what she can do to tx her symptoms.        Past Medical History:  Diagnosis Date   Arthritis    Diabetes mellitus without complication (HCC)    Hypertension    Past Surgical History:  Procedure Laterality Date   BUNIONECTOMY Bilateral 2007   CARPAL TUNNEL RELEASE Right 07/2019   REPLACEMENT TOTAL KNEE Left 03/2019   REPLACEMENT TOTAL KNEE Right 12/2018   TOE SURGERY Right    great toe x3   TONSILLECTOMY AND ADENOIDECTOMY     TYMPANOSTOMY TUBE PLACEMENT     WISDOM TOOTH EXTRACTION     20s   Social History   Socioeconomic History   Marital status: Divorced    Spouse name: Not on file   Number of children: Not on file   Years of education: Not on file   Highest education level: Not on file  Occupational History   Not on file  Tobacco Use   Smoking status: Every Day    Current packs/day: 0.75    Average packs/day: 0.8 packs/day for 32.0 years (24.0 ttl pk-yrs)    Types:  Cigarettes    Passive exposure: Current   Smokeless tobacco: Never  Vaping Use   Vaping status: Former  Substance and Sexual Activity   Alcohol use: Yes    Comment: occ   Drug use: Never   Sexual activity: Not on file  Other Topics Concern   Not on file  Social History Narrative   ** Merged History Encounter **       Social Drivers of Health   Financial Resource Strain: Low Risk  (06/18/2023)   Received from [facility]   Overall Financial Resource Strain (CARDIA)    Difficulty of Paying Living Expenses: Not hard at all  Food Insecurity: No Food Insecurity (06/18/2023)   Received from [facility]   Hunger Vital Sign    Within the past 12 months, you worried that your food would run out before you got the money to buy more.: Never true    Within the past 12 months, the food you bought just didn't last and you didn't have money to get more.: Never true  Transportation Needs: No Transportation Needs (06/18/2023)   Received from [facility] - Transportation    Lack of Transportation (Medical): No    Lack of Transportation (Non-Medical): No  Physical Activity: Unknown (06/18/2023)   Received from [facility]   Exercise Vital Sign    On  average, how many days per week do you engage in moderate to strenuous exercise (like a brisk walk)?: 0 days    Minutes of Exercise per Session: Not on file  Stress: Stress Concern Present (06/18/2023)   Received from [facility] - Occupational Stress Questionnaire    Feeling of Stress : To some extent  Social Connections: Socially Integrated (06/18/2023)   Received from [facility]   Social Network    How would you rate your social network (family, work, friends)?: Good participation with social networks   No Known Allergies Family History  Problem Relation Age of Onset   Hypertension Mother    Diabetes Father    COPD Father    Hypertension Father    Cancer Sister    High  Cholesterol Brother    Crohn's disease Son     Current Outpatient Medications (Endocrine & Metabolic):    metFORMIN (GLUCOPHAGE-XR) 500 MG 24 hr tablet,   Current Outpatient Medications (Cardiovascular):    lisinopril-hydrochlorothiazide (PRINZIDE,ZESTORETIC) 20-12.5 MG tablet, Take by mouth.   rosuvastatin (CRESTOR) 10 MG tablet, Take by mouth.  Current Outpatient Medications (Respiratory):    fexofenadine (ALLEGRA) 180 MG tablet, Take 180 mg by mouth daily.  Current Outpatient Medications (Analgesics):    diclofenac (VOLTAREN) 75 MG EC tablet, 75 mg 2 (two) times daily.  Current Outpatient Medications (Hematological):    folic acid  (FOLVITE ) 1 MG tablet, Take 2 tablets (2 mg total) by mouth daily.  Current Outpatient Medications (Other):    buPROPion (WELLBUTRIN XL) 150 MG 24 hr tablet, Take by mouth.   cholecalciferol (VITAMIN D3) 25 MCG (1000 UNIT) tablet, Take 1,000 Units by mouth daily.   cyclobenzaprine (FLEXERIL) 10 MG tablet, Take 10 mg by mouth at bedtime.   DULoxetine (CYMBALTA) 60 MG capsule, Take 1 capsule by mouth daily.   hydrOXYzine (ATARAX) 10 MG tablet, Take by mouth as needed.   ixekizumab  (TALTZ ) 80 MG/ML pen, Inject 160mg  at Week 0 and 80mg  Week 2 **ALL REFILLS TO [facility] WITH LILLYCARES**   ixekizumab  (TALTZ ) 80 MG/ML pen, Inject 1 mL (80 mg total) into the skin every 28 (twenty-eight) days. **ALL REFILLS TO [facility] WITH LILLYCARES**   ixekizumab  (TALTZ ) 80 MG/ML pen, Inject 80mg  into the skin at Week 10, Week 12 and the Inject 80 mg into skin every 28 days.   methotrexate  (RHEUMATREX) 2.5 MG tablet, TAKE 10 TABLETS BY MOUTH ONCE A WEEK **PROTECT FROM LIGHT**   Multiple Vitamin (MULTIVITAMIN) tablet, Take 1 tablet by mouth daily.   Reviewed prior external information including notes and imaging from  primary care provider As well as notes that were available from care everywhere and other healthcare systems.  Past medical history, social,  surgical and family history all reviewed in electronic medical record.  No pertanent information unless stated regarding to the chief complaint.   Review of Systems:  No headache, visual changes, nausea, vomiting, diarrhea, constipation, dizziness, abdominal pain, skin rash, fevers, chills, night sweats, weight loss, swollen lymph nodes, body aches, joint swelling, chest pain, shortness of breath, mood changes. POSITIVE muscle aches  Objective  Blood pressure 124/84, pulse 72, height 5' 3 (1.6 m), weight 223 lb (101.2 kg), SpO2 98%.   General: No apparent distress alert and oriented x3 mood and affect normal, dressed appropriately.  HEENT: Pupils equal, extraocular movements intact  Respiratory: Patient's speak in full sentences and does not appear short of breath  Cardiovascular: No lower extremity edema, non  tender, no erythema  Patient does have hypermobility noted, patient does have a Beighton score of 9. Patient does have significant psoriatic changes of the hands and feet noted.  Antalgic gait noted.  4 out of 5 strength of the muscle strength but symmetric.    Impression and Recommendations:    The above documentation has been reviewed and is accurate and complete Christohper Dube M Benecio Kluger, DO

## 2023-11-05 NOTE — Assessment & Plan Note (Signed)
 Hypermobility has been noted for quite some time but has had multiple different dislocations noted of the left shoulder previously.  Patient has also had significant psoriatic arthritis so difficult to assess which 1 is causing which type of pain.  Discussed with patient that will get some laboratory workup to see if anything is potentially elevated.  Will get a echocardiogram to further evaluate the aorta, encouraged her to have once annual eye exams, discussed with patient over-the-counter medications that could be helpful in limiting certain range of motion.  Will work with physical therapy who does specialize in hypermobility disorders.  Follow-up with me again in 3 months otherwise.

## 2023-11-07 ENCOUNTER — Telehealth: Payer: Self-pay

## 2023-11-07 LAB — CYCLIC CITRUL PEPTIDE ANTIBODY, IGG: Cyclic Citrullin Peptide Ab: <16 U

## 2023-11-07 LAB — ANTI-NUCLEAR AB-TITER (ANA TITER): ANA Titer 1: 1:40 {titer} — ABNORMAL HIGH

## 2023-11-07 LAB — CALCIUM, IONIZED: Calcium, Ion: 4.8 mg/dL (ref 4.7–5.5)

## 2023-11-07 LAB — ANGIOTENSIN CONVERTING ENZYME: Angiotensin-Converting Enzyme: 9 U/L (ref 9–67)

## 2023-11-07 LAB — RHEUMATOID FACTOR: Rheumatoid fact SerPl-aCnc: <10 [IU]/mL (ref <14)

## 2023-11-07 LAB — PTH, INTACT AND CALCIUM
Calcium: 8.9 mg/dL (ref 8.6–10.4)
PTH: 14 pg/mL — ABNORMAL LOW (ref 16–77)

## 2023-11-07 LAB — ANA: Anti Nuclear Antibody (ANA): POSITIVE — AB

## 2023-11-07 NOTE — Telephone Encounter (Signed)
 Patient called the office very upset stating that her prescription was denied and she is not sure why, after checking the prescription was sent in on 10/30/2023. Patient stated that [facility] told her they did not have a prescription for her Taltz . Advised the patient I would call the pharmacy to see what's going on since a prescription has been sent and that I would call her back to let her know what was discussed.

## 2023-11-07 NOTE — Telephone Encounter (Signed)
 Contacted [facility] and spoke with Clyde who stated that they tried  to send something to the office and after 3 attempts they deny. He was able to take a verbal an was given the dose information of  Inject 80mg  into the skin at Week 10, Week 12 and the Inject 80 mg into skin every 28 days. With a 3 month supply. Advised that he marked it as urgent so that patient would hopefully receive it early next.

## 2023-11-07 NOTE — Telephone Encounter (Signed)
 Contacted patient an explained what had happened and expressed sincere apologies for the hold up but assured that it had been taken care of an that her medication would be out as soon as next week. Patient verbalized understanding.

## 2023-12-12 ENCOUNTER — Other Ambulatory Visit: Payer: Self-pay | Admitting: Physician Assistant

## 2023-12-12 NOTE — Telephone Encounter (Signed)
 Last Fill: 11/30/2022  Next Visit: 01/13/2024  Last Visit: 10/14/2023  DX: Psoriatic arthritis   Current Dose per office note on 10/14/2023:  folic acid  2 tablets by mouth daily.   Okay to refill folic acid ?

## 2023-12-18 ENCOUNTER — Ambulatory Visit (HOSPITAL_COMMUNITY)
Admission: RE | Admit: 2023-12-18 | Discharge: 2023-12-18 | Disposition: A | Discharge status: Home / Self Care | Source: Ambulatory Visit | Attending: Internal Medicine | Admitting: Internal Medicine

## 2023-12-18 DIAGNOSIS — R609 Edema, unspecified: Secondary | ICD-10-CM | POA: Diagnosis present

## 2023-12-18 DIAGNOSIS — R600 Localized edema: Secondary | ICD-10-CM | POA: Diagnosis not present

## 2023-12-18 LAB — ECHOCARDIOGRAM COMPLETE
Area-P 1/2: 4.54 cm2
S' Lateral: 2.7 cm

## 2023-12-23 ENCOUNTER — Telehealth: Payer: Self-pay | Admitting: Pharmacist

## 2023-12-23 NOTE — Telephone Encounter (Signed)
 Called patient regarding Taltz  PAP renewal application. Patient forms mailed to (confirmed) address on file. Requested she bring completed application to [facility] on 01/13/2024  She states she had a misfire of Taltz  last week. She will need a sample at upcoming oV  Prescriber form placed in Waddell Craze, PA-C's folder for signature  She does not have Part D or prescription coverage (only Medicare A/B)  Sherry Pennant, PharmD, MPH, BCPS, CPP Clinical Pharmacist [facility])

## 2023-12-30 NOTE — Progress Notes (Deleted)
 Office Visit Note  Patient: Sherry Stein             Date of Birth: 08/23/1970           MRN: [id number]             PCP: Emilio Joesph DEL, PA-C Referring: Sherry Stein KATHEE DEVONNA Visit Date: 01/13/2024 Occupation: Data Unavailable  Subjective:  No chief complaint on file.   History of Present Illness: Sherry Stein is a 53 y.o. female ***     Activities of Daily Living:  Patient reports morning stiffness for *** {minute/hour:19697}.   Patient {ACTIONS;DENIES/REPORTS:21021675::Denies} nocturnal pain.  Difficulty dressing/grooming: {ACTIONS;DENIES/REPORTS:21021675::Denies} Difficulty climbing stairs: {ACTIONS;DENIES/REPORTS:21021675::Denies} Difficulty getting out of chair: {ACTIONS;DENIES/REPORTS:21021675::Denies} Difficulty using hands for taps, buttons, cutlery, and/or writing: {ACTIONS;DENIES/REPORTS:21021675::Denies}  No Rheumatology ROS completed.   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   Psoriasis 05/09/2015   Menorrhagia with irregular cycle 04/10/2015   Hammer toe of right foot 04/07/2015   Pain from implanted hardware 04/07/2015    Past Stein History:  Diagnosis Date   Arthritis    Diabetes mellitus without complication (HCC)    Hypertension     Family History  Problem Relation Age of Onset   Hypertension Mother    Diabetes Father    COPD Father    Hypertension Father     Cancer Sister    High Cholesterol Brother    Crohn's disease Son    Past Surgical History:  Procedure Laterality Date   BUNIONECTOMY Bilateral 2007   CARPAL TUNNEL RELEASE Right 07/2019   REPLACEMENT TOTAL KNEE Left 03/2019   REPLACEMENT TOTAL KNEE Right 12/2018   TOE SURGERY Right    great toe x3   TONSILLECTOMY AND ADENOIDECTOMY     TYMPANOSTOMY TUBE PLACEMENT     WISDOM TOOTH EXTRACTION     20s   Social History   Tobacco Use   Smoking status: Every Day    Current packs/day: 0.75    Average packs/day: 0.8 packs/day for 32.0 years (24.0 ttl pk-yrs)    Types: Cigarettes    Passive exposure: Current   Smokeless tobacco: Never  Vaping Use   Vaping status: Former  Substance Use Topics   Alcohol use: Yes    Comment: occ   Drug use: Never   Social History   Social History Narrative   ** Merged History Encounter **         Immunization History  Administered Date(s) Administered   PFIZER(Purple Top)SARS-COV-2 Vaccination 11/24/2019, 12/15/2019   Pfizer Covid-19 Vaccine Bivalent Booster 22yrs & up 03/01/2021   Pfizer(Comirnaty)Fall Seasonal Vaccine 12 years and older 11/28/2021     Objective: Vital Signs: There were no vitals taken for this visit.   Physical Exam   Musculoskeletal Exam: ***  CDAI Exam: CDAI Score: -- Patient Global: --; Provider Global: -- Swollen: --; Tender: -- Joint Exam 01/13/2024   No joint exam has been documented for this visit   There is  currently no information documented on the homunculus. Go to the Rheumatology activity and complete the homunculus joint exam.  Investigation: No additional findings.  Imaging: ECHOCARDIOGRAM COMPLETE Result Date: 12/18/2023    ECHOCARDIOGRAM REPORT   Patient Name:   Sherry Stein [place] Stein Date of Exam: 12/18/2023 Stein Rec #:  [id number]               Height:       63.0 in Accession #:    [id number]              Weight:       223.0 lb Date of Birth:  April 12, 1970               BSA:          2.025  m Patient Age:    53 years                BP:           142/92 mmHg Patient Gender: F                       HR:           78 bpm. Exam Location:  [address] Procedure: 2D Echo, 3D Echo, Cardiac Doppler and Color Doppler (Both Spectral            and Color Flow Doppler were utilized during procedure). Indications:    R60.9 Edema  History:        Patient has prior history of Echocardiogram examinations. Risk                 Factors:Hypertension and Diabetes.  Sonographer:    Waldo Guadalajara RCS Referring Phys: [phone] ARTHEA HERO SMITH IMPRESSIONS  1. Left ventricular ejection fraction, by estimation, is 60 to 65%. Left ventricular ejection fraction by 3D volume is 62 %. The left ventricle has normal function. The left ventricle has no regional wall motion abnormalities. Left ventricular diastolic  parameters were normal.  2. Right ventricular systolic function is normal. The right ventricular size is normal. There is normal pulmonary artery systolic pressure.  3. The mitral valve is normal in structure. No evidence of mitral valve regurgitation. No evidence of mitral stenosis.  4. The aortic valve was not well visualized. There is mild calcification of the aortic valve. Aortic valve regurgitation is not visualized. Aortic valve sclerosis/calcification is present, without any evidence of aortic stenosis.  5. The inferior vena cava is normal in size with <50% respiratory variability, suggesting right atrial pressure of 8 mmHg. FINDINGS  Left Ventricle: Left ventricular ejection fraction, by estimation, is 60 to 65%. Left ventricular ejection fraction by 3D volume is 62 %. The left ventricle has normal function. The left ventricle has no regional wall motion abnormalities. The left ventricular internal cavity size was normal in size. There is no left ventricular hypertrophy. Left ventricular diastolic parameters were normal. Right Ventricle: The right ventricular size is normal. No increase in right ventricular wall  thickness. Right ventricular systolic function is normal. There is normal pulmonary artery systolic pressure. The tricuspid regurgitant velocity is 1.68 m/s, and  with an assumed right atrial pressure of 3 mmHg, the estimated right ventricular systolic pressure is 14.3 mmHg. Left Atrium: Left atrial size was normal in size. Right Atrium: Right atrial size was normal in size. Pericardium: There is no evidence of pericardial effusion. Mitral Valve: The mitral valve is normal in structure. No evidence of mitral valve regurgitation.  No evidence of mitral valve stenosis. Tricuspid Valve: The tricuspid valve is normal in structure. Tricuspid valve regurgitation is not demonstrated. No evidence of tricuspid stenosis. Aortic Valve: The aortic valve was not well visualized. There is mild calcification of the aortic valve. Aortic valve regurgitation is not visualized. Aortic valve sclerosis/calcification is present, without any evidence of aortic stenosis. Pulmonic Valve: The pulmonic valve was not well visualized. Pulmonic valve regurgitation is not visualized. No evidence of pulmonic stenosis. Aorta: The aortic root is normal in size and structure. Venous: The inferior vena cava is normal in size with less than 50% respiratory variability, suggesting right atrial pressure of 8 mmHg. IAS/Shunts: No atrial level shunt detected by color flow Doppler. Additional Comments: 3D was performed not requiring image post processing on an independent workstation and was normal.  LEFT VENTRICLE PLAX 2D LVIDd:         3.90 cm         Diastology LVIDs:         2.70 cm         LV e' medial:    7.18 cm/s LV PW:         1.10 cm         LV E/e' medial:  10.7 LV IVS:        0.90 cm         LV e' lateral:   10.60 cm/s LVOT diam:     2.10 cm         LV E/e' lateral: 7.2 LV SV:         79 LV SV Index:   39              2D Longitudinal LVOT Area:     3.46 cm        Strain LV IVRT:       116 msec        2D Strain GLS   -15.2 %                                 (A4C):                                2D Strain GLS   -15.6 %                                (A3C):                                2D Strain GLS   -19.8 %                                (A2C):                                2D Strain GLS   -16.8 %                                Avg:  3D Volume EF                                LV 3D EF:    Left                                             ventricul                                             ar                                             ejection                                             fraction                                             by 3D                                             volume is                                             62 %.                                 3D Volume EF:                                3D EF:        62 %                                LV EDV:       84 ml                                LV ESV:       32 ml                                LV SV:        52 ml RIGHT VENTRICLE RV Basal diam:  3.00 cm     PULMONARY VEINS RV S prime:     13.23 cm/s  A Reversal Velocity: 26.70 cm/s TAPSE (M-mode): 1.7 cm      Diastolic Velocity:  50.30 cm/s RVSP:           14.3 mmHg   S/D Velocity:        1.10                             Systolic Velocity:   57.00 cm/s LEFT ATRIUM             Index        RIGHT ATRIUM           Index LA diam:        3.50 cm 1.73 cm/m   RA Pressure: 3.00 mmHg LA Vol (A2C):   37.2 ml 18.37 ml/m  RA Area:     9.35 cm LA Vol (A4C):   35.6 ml 17.58 ml/m  RA Volume:   17.70 ml  8.74 ml/m LA Biplane Vol: 38.3 ml 18.91 ml/m  AORTIC VALVE LVOT Vmax:   124.00 cm/s LVOT Vmean:  77.500 cm/s LVOT VTI:    0.229 m  AORTA Ao Root diam: 3.30 cm Ao Asc diam:  2.60 cm MITRAL VALVE               TRICUSPID VALVE MV Area (PHT):             TR Peak grad:   11.3 mmHg MV Decel Time:             TR Vmax:        168.00 cm/s MV E velocity: 76.50 cm/s  Estimated RAP:  3.00 mmHg MV A velocity: 86.50 cm/s   RVSP:           14.3 mmHg MV E/A ratio:  0.88                            SHUNTS                            Systemic VTI:  0.23 m                            Systemic Diam: 2.10 cm Joelle Cedars Tonleu Electronically signed by Joelle Cedars Tonleu Signature Date/Time: 12/18/2023/3:38:40 PM    Final     Recent Labs: Lab Results  Component Value Date   WBC 7.4 11/05/2023   HGB 15.0 11/05/2023   PLT 248.0 11/05/2023   NA 139 11/05/2023   K 3.6 11/05/2023   CL 101 11/05/2023   CO2 29 11/05/2023   GLUCOSE 123 (H) 11/05/2023   BUN 19 11/05/2023   CREATININE 0.73 11/05/2023   BILITOT 0.5 11/05/2023   ALKPHOS 55 11/05/2023   AST 23 11/05/2023   ALT 27 11/05/2023   PROT 6.9 11/05/2023   ALBUMIN 4.5 11/05/2023   CALCIUM 8.9 11/05/2023   CALCIUM 9.0 11/05/2023   GFRAA 105 06/13/2020   QFTBGOLDPLUS NEGATIVE 04/04/2023    Speciality Comments: MTX since2004, Humira-2004-2017-quit working, cosyntex 2018x 8 months, restarted May 11, 2020-12/24   FU every 3 months  Procedures:  No procedures performed Allergies: Patient has no known allergies.   Assessment / Plan:     Visit Diagnoses: Psoriatic arthritis (HCC)  Psoriasis  High risk medication use  Pain of right thumb  Primary osteoarthritis of both hands  Trigger finger, right ring finger  Status post total knee replacement, bilateral  Pain in both  feet  Bilateral sacroiliitis  Degeneration of intervertebral disc of lumbar region without discogenic back pain or lower extremity pain  Family history of Crohn's disease  Hiatal hernia with GERD without esophagitis  Mixed hyperlipidemia  Benign essential hypertension  Prediabetes  GAD (generalized anxiety disorder)  Hypermobility arthralgia  Smoker  Orders: No orders of the defined types were placed in this encounter.  No orders of the defined types were placed in this encounter.   Face-to-face time spent with patient was *** minutes. Greater than 50% of time  was spent in counseling and coordination of care.  Follow-Up Instructions: No follow-ups on file.   Waddell CHRISTELLA Craze, PA-C  Note - This record has been created using Dragon software.  Chart creation errors have been sought, but may not always  have been located. Such creation errors do not reflect on  the standard of Stein care.

## 2024-01-13 ENCOUNTER — Ambulatory Visit: Admitting: Physician Assistant

## 2024-01-13 DIAGNOSIS — M461 Sacroiliitis, not elsewhere classified: Secondary | ICD-10-CM

## 2024-01-13 DIAGNOSIS — M51369 Other intervertebral disc degeneration, lumbar region without mention of lumbar back pain or lower extremity pain: Secondary | ICD-10-CM

## 2024-01-13 DIAGNOSIS — E782 Mixed hyperlipidemia: Secondary | ICD-10-CM

## 2024-01-13 DIAGNOSIS — I10 Essential (primary) hypertension: Secondary | ICD-10-CM

## 2024-01-13 DIAGNOSIS — F17200 Nicotine dependence, unspecified, uncomplicated: Secondary | ICD-10-CM

## 2024-01-13 DIAGNOSIS — M2550 Pain in unspecified joint: Secondary | ICD-10-CM

## 2024-01-13 DIAGNOSIS — F411 Generalized anxiety disorder: Secondary | ICD-10-CM

## 2024-01-13 DIAGNOSIS — M19041 Primary osteoarthritis, right hand: Secondary | ICD-10-CM

## 2024-01-13 DIAGNOSIS — M65341 Trigger finger, right ring finger: Secondary | ICD-10-CM

## 2024-01-13 DIAGNOSIS — L409 Psoriasis, unspecified: Secondary | ICD-10-CM

## 2024-01-13 DIAGNOSIS — M79644 Pain in right finger(s): Secondary | ICD-10-CM

## 2024-01-13 DIAGNOSIS — M79671 Pain in right foot: Secondary | ICD-10-CM

## 2024-01-13 DIAGNOSIS — K219 Gastro-esophageal reflux disease without esophagitis: Secondary | ICD-10-CM

## 2024-01-13 DIAGNOSIS — Z8379 Family history of other diseases of the digestive system: Secondary | ICD-10-CM

## 2024-01-13 DIAGNOSIS — Z79899 Other long term (current) drug therapy: Secondary | ICD-10-CM

## 2024-01-13 DIAGNOSIS — L4050 Arthropathic psoriasis, unspecified: Secondary | ICD-10-CM

## 2024-01-13 DIAGNOSIS — Z96653 Presence of artificial knee joint, bilateral: Secondary | ICD-10-CM

## 2024-01-13 DIAGNOSIS — R7303 Prediabetes: Secondary | ICD-10-CM

## 2024-01-21 NOTE — Telephone Encounter (Signed)
 Received signed patient forms.  Submitted Patient Assistance RENEWAL Application to [employer] for TALTZ  with patient portion, provider portion, insurance card copy, prior authorization approval, and current medication list. Will update patient when we receive a response.  Phone: [phone] Fax: [phone]

## 2024-01-22 ENCOUNTER — Telehealth: Payer: Self-pay | Admitting: *Deleted

## 2024-01-22 DIAGNOSIS — Z79899 Other long term (current) drug therapy: Secondary | ICD-10-CM

## 2024-01-22 NOTE — Telephone Encounter (Signed)
 Patient advised she is due to update labs. Patient will update labs on Friday. Lab orders released.

## 2024-01-22 NOTE — Telephone Encounter (Signed)
-----   Message from Sherry GORMAN Pennant sent at 01/21/2024  2:35 PM EST ----- Patient's renewal application for Taltz  sent in today. Please contact patient when due for labs   Thank you!

## 2024-01-28 ENCOUNTER — Other Ambulatory Visit: Payer: Self-pay | Admitting: *Deleted

## 2024-01-28 ENCOUNTER — Telehealth: Payer: Self-pay | Admitting: *Deleted

## 2024-01-28 DIAGNOSIS — L4050 Arthropathic psoriasis, unspecified: Secondary | ICD-10-CM

## 2024-01-28 DIAGNOSIS — L409 Psoriasis, unspecified: Secondary | ICD-10-CM

## 2024-01-28 DIAGNOSIS — Z79899 Other long term (current) drug therapy: Secondary | ICD-10-CM

## 2024-01-28 NOTE — Telephone Encounter (Addendum)
 Medication Samples have been provided to the patient.  Drug name: Taltz        Strength: 80 mg        Qty: 1  LOT: [id number]  Exp.Date: 03/10/2025  Dosing instructions: Inject 1 mL into the skin every 28 days.

## 2024-01-29 ENCOUNTER — Ambulatory Visit: Payer: Self-pay | Admitting: Rheumatology

## 2024-01-29 LAB — COMPREHENSIVE METABOLIC PANEL WITH GFR
AG Ratio: 2.1 (calc) (ref 1.0–2.5)
ALT: 30 U/L — ABNORMAL HIGH (ref 6–29)
AST: 20 U/L (ref 10–35)
Albumin: 4.7 g/dL (ref 3.6–5.1)
Alkaline phosphatase (APISO): 75 U/L (ref 37–153)
BUN: 16 mg/dL (ref 7–25)
CO2: 29 mmol/L (ref 20–32)
Calcium: 9.3 mg/dL (ref 8.6–10.4)
Chloride: 104 mmol/L (ref 98–110)
Creat: 0.71 mg/dL (ref 0.50–1.03)
Globulin: 2.2 g/dL (ref 1.9–3.7)
Glucose, Bld: 157 mg/dL — ABNORMAL HIGH (ref 65–99)
Potassium: 4.3 mmol/L (ref 3.5–5.3)
Sodium: 138 mmol/L (ref 135–146)
Total Bilirubin: 0.4 mg/dL (ref 0.2–1.2)
Total Protein: 6.9 g/dL (ref 6.1–8.1)
eGFR: 100 mL/min/1.73m2

## 2024-01-29 LAB — CBC WITH DIFFERENTIAL/PLATELET
Absolute Lymphocytes: 1755 {cells}/uL (ref 850–3900)
Absolute Monocytes: 336 {cells}/uL (ref 200–950)
Basophils Absolute: 41 {cells}/uL (ref 0–200)
Basophils Relative: 0.5 %
Eosinophils Absolute: 295 {cells}/uL (ref 15–500)
Eosinophils Relative: 3.6 %
HCT: 45.9 % (ref 35.9–46.0)
Hemoglobin: 15.1 g/dL (ref 11.7–15.5)
MCH: 32.8 pg (ref 27.0–33.0)
MCHC: 32.9 g/dL (ref 31.6–35.4)
MCV: 99.8 fL (ref 81.4–101.7)
MPV: 10.2 fL (ref 7.5–12.5)
Monocytes Relative: 4.1 %
Neutro Abs: 5773 {cells}/uL (ref 1500–7800)
Neutrophils Relative %: 70.4 %
Platelets: 280 Thousand/uL (ref 140–400)
RBC: 4.6 Million/uL (ref 3.80–5.10)
RDW: 13.2 % (ref 11.0–15.0)
Total Lymphocyte: 21.4 %
WBC: 8.2 Thousand/uL (ref 3.8–10.8)

## 2024-01-29 NOTE — Progress Notes (Signed)
 CBC is normal, glucose is elevated, liver function is mildly elevated most likely due to the combination of methotrexate  and statins.  Patient should avoid all NSAIDs and alcohol use.  We will continue to check labs every 3 months.  If LFTs remain elevated we may reduce the dose of methotrexate .  Please forward results to her PCP.

## 2024-02-03 NOTE — Progress Notes (Deleted)
 " [facility] [address] [place] [address] Phone: [phone] Subjective:    I'm seeing this patient by the request  of:  Emilio Joesph VEAR DEVONNA  CC:   YEP:Dlagzrupcz  11/05/2023 Hypermobility has been noted for quite some time but has had multiple different dislocations noted of the left shoulder previously.  Patient has also had significant psoriatic arthritis so difficult to assess which 1 is causing which type of pain.  Discussed with patient that will get some laboratory workup to see if anything is potentially elevated.  Will get a echocardiogram to further evaluate the aorta, encouraged her to have once annual eye exams, discussed with patient over-the-counter medications that could be helpful in limiting certain range of motion.  Will work with physical therapy who does specialize in hypermobility disorders.  Follow-up with me again in 3 months otherwise.      Update 02/04/2024 Jaidy Cottam is a 53 y.o. female coming in with complaint of fatigue and hypermobility. Patient states       Past Medical History:  Diagnosis Date   Arthritis    Diabetes mellitus without complication (HCC)    Hypertension    Past Surgical History:  Procedure Laterality Date   BUNIONECTOMY Bilateral 2007   CARPAL TUNNEL RELEASE Right 07/2019   REPLACEMENT TOTAL KNEE Left 03/2019   REPLACEMENT TOTAL KNEE Right 12/2018   TOE SURGERY Right    great toe x3   TONSILLECTOMY AND ADENOIDECTOMY     TYMPANOSTOMY TUBE PLACEMENT     WISDOM TOOTH EXTRACTION     20s   Social History   Socioeconomic History   Marital status: Divorced    Spouse name: Not on file   Number of children: Not on file   Years of education: Not on file   Highest education level: Not on file  Occupational History   Not on file  Tobacco Use   Smoking status: Every Day    Current packs/day: 0.75    Average packs/day: 0.8 packs/day for 32.0 years (24.0 ttl pk-yrs)    Types:  Cigarettes    Passive exposure: Current   Smokeless tobacco: Never  Vaping Use   Vaping status: Former  Substance and Sexual Activity   Alcohol use: Yes    Comment: occ   Drug use: Never   Sexual activity: Not on file  Other Topics Concern   Not on file  Social History Narrative   ** Merged History Encounter **       Social Drivers of Health   Tobacco Use: High Risk (11/28/2023)   Received from [facility]   Patient History    Smoking Tobacco Use: Every Day    Smokeless Tobacco Use: Never    Passive Exposure: Current  Financial Resource Strain: Low Risk (11/28/2023)   Received from [facility]   Overall Financial Resource Strain (CARDIA)    How hard is it for you to pay for the very basics like food, housing, medical care, and heating?: Not very hard  Food Insecurity: No Food Insecurity (11/28/2023)   Received from [facility]   Epic    Within the past 12 months, you worried that your food would run out before you got the money to buy more.: Never true    Within the past 12 months, the food you bought just didn't last and you didn't have money to get more.: Never true  Transportation Needs: No Transportation Needs (11/28/2023)   Received from [facility]   Epic    In the past 12 months, has lack of transportation kept you from medical appointments or from getting medications?: No    In the past 12 months, has lack of transportation kept you from meetings, work, or from getting things needed for daily living?: No  Physical Activity: Insufficiently Active (11/28/2023)   Received from [facility]   Exercise Vital Sign    On average, how many days per week do you engage in moderate to strenuous exercise (like a brisk walk)?: 2 days    On average, how many minutes do you engage in exercise at this level?: 20 min  Stress: No Stress Concern Present (11/28/2023)   Received from [facility] - Occupational Stress Questionnaire     Do you feel stress - tense, restless, nervous, or anxious, or unable to sleep at night because your mind is troubled all the time - these days?: Only a little  Social Connections: Moderately Integrated (11/28/2023)   Received from [facility]   Social Network    How would you rate your social network (family, work, friends)?: Adequate participation with social networks  Depression (PHQ2-9): Not on file  Alcohol Screen: Not on file  Housing: Low Risk (11/28/2023)   Received from [facility]    In the last 12 months, was there a time when you were not able to pay the mortgage or rent on time?: No    In the past 12 months, how many times have you moved where you were living?: 0    At any time in the past 12 months, were you homeless or living in a shelter (including now)?: No  Utilities: Not At Risk (11/28/2023)   Received from [facility]    In the past 12 months has the electric, gas, oil, or water company threatened to shut off services in your home?: No  Health Literacy: Not on file   Allergies[1] Family History  Problem Relation Age of Onset   Hypertension Mother    Diabetes Father    COPD Father    Hypertension Father    Cancer Sister    High Cholesterol Brother    Crohn's disease Son     Current Outpatient Medications (Endocrine & Metabolic):    metFORMIN (GLUCOPHAGE-XR) 500 MG 24 hr tablet,   Current Outpatient Medications (Cardiovascular):    lisinopril-hydrochlorothiazide (PRINZIDE,ZESTORETIC) 20-12.5 MG tablet, Take by mouth.   rosuvastatin (CRESTOR) 10 MG tablet, Take by mouth.  Current Outpatient Medications (Respiratory):    fexofenadine (ALLEGRA) 180 MG tablet, Take 180 mg by mouth daily.  Current Outpatient Medications (Analgesics):    diclofenac (VOLTAREN) 75 MG EC tablet, 75 mg 2 (two) times daily.  Current Outpatient Medications (Hematological):    folic acid  (FOLVITE ) 1 MG tablet, TAKE 2 TABLETS BY MOUTH DAILY  Current Outpatient  Medications (Other):    buPROPion (WELLBUTRIN XL) 150 MG 24 hr tablet, Take by mouth.   cholecalciferol (VITAMIN D3) 25 MCG (1000 UNIT) tablet, Take 1,000 Units by mouth daily.   cyclobenzaprine (FLEXERIL) 10 MG tablet, Take 10 mg by mouth at bedtime.   DULoxetine (CYMBALTA) 60 MG capsule, Take 1 capsule by mouth daily.   hydrOXYzine (ATARAX) 10 MG tablet, Take by mouth as needed.   ixekizumab  (TALTZ ) 80 MG/ML pen, Inject 1 mL (80 mg total) into the skin every 28 (twenty-eight) days. **ALL REFILLS TO [facility] WITH [facility]**   methotrexate  (  RHEUMATREX) 2.5 MG tablet, TAKE 10 TABLETS BY MOUTH ONCE A WEEK **PROTECT FROM LIGHT**   Multiple Vitamin (MULTIVITAMIN) tablet, Take 1 tablet by mouth daily.   Reviewed prior external information including notes and imaging from  primary care provider As well as notes that were available from care everywhere and other healthcare systems.  Past medical history, social, surgical and family history all reviewed in electronic medical record.  No pertanent information unless stated regarding to the chief complaint.   Review of Systems:  No headache, visual changes, nausea, vomiting, diarrhea, constipation, dizziness, abdominal pain, skin rash, fevers, chills, night sweats, weight loss, swollen lymph nodes, body aches, joint swelling, chest pain, shortness of breath, mood changes. POSITIVE muscle aches  Objective  There were no vitals taken for this visit.   General: No apparent distress alert and oriented x3 mood and affect normal, dressed appropriately.  HEENT: Pupils equal, extraocular movements intact  Respiratory: Patient's speak in full sentences and does not appear short of breath  Cardiovascular: No lower extremity edema, non tender, no erythema      Impression and Recommendations:           [1] No Known Allergies  "

## 2024-02-04 ENCOUNTER — Ambulatory Visit: Admitting: Family Medicine

## 2024-02-04 NOTE — Telephone Encounter (Signed)
 ALT was very mildly elevated.  AST WNL.  Ok to resume diclofenac with close monitoring.   Recheck in 1 month--if remains elevated we can reduce methotrexate .

## 2024-02-23 ENCOUNTER — Other Ambulatory Visit: Payer: Self-pay | Admitting: Rheumatology

## 2024-02-23 DIAGNOSIS — L409 Psoriasis, unspecified: Secondary | ICD-10-CM

## 2024-02-23 DIAGNOSIS — Z79899 Other long term (current) drug therapy: Secondary | ICD-10-CM

## 2024-02-23 DIAGNOSIS — L4050 Arthropathic psoriasis, unspecified: Secondary | ICD-10-CM

## 2024-02-24 NOTE — Telephone Encounter (Signed)
 Last Fill: 09/02/2023  Labs: 01/28/2024 CBC is normal, glucose is elevated, liver function is mildly elevated most likely due to the combination of methotrexate  and statins. Patient should avoid all NSAIDs and alcohol use. We will continue to check labs every 3 months. If LFTs remain elevated we may reduce the dose of methotrexate .   TB Gold: 04/04/2023 negative    Next Visit: 03/10/2024  Last Visit: 10/14/2023  DX: Psoriatic arthritis   Current Dose per office note on 10/14/2023: Taltz  80 mg sq injections every 28 days   Okay to refill Taltz ?

## 2024-02-25 NOTE — Progress Notes (Unsigned)
 "  Office Visit Note  Patient: Sherry Stein             Date of Birth: 07-04-1970           MRN: [id number]             PCP: Emilio Joesph DEL, PA-C Referring: Vaughn Lauraine KATHEE DEVONNA Visit Date: 03/10/2024 Occupation: Data Unavailable  Subjective:  No chief complaint on file.   History of Present Illness: Sherry Stein is a 54 y.o. female ***     Activities of Daily Living:  Patient reports morning stiffness for *** {minute/hour:19697}.   Patient {ACTIONS;DENIES/REPORTS:21021675::Denies} nocturnal pain.  Difficulty dressing/grooming: {ACTIONS;DENIES/REPORTS:21021675::Denies} Difficulty climbing stairs: {ACTIONS;DENIES/REPORTS:21021675::Denies} Difficulty getting out of chair: {ACTIONS;DENIES/REPORTS:21021675::Denies} Difficulty using hands for taps, buttons, cutlery, and/or writing: {ACTIONS;DENIES/REPORTS:21021675::Denies}  No Rheumatology ROS completed.   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   Psoriasis 05/09/2015   Menorrhagia with irregular cycle 04/10/2015   Hammer toe of right foot 04/07/2015   Pain from implanted hardware 04/07/2015    Past Medical History:  Diagnosis Date   Arthritis    Diabetes mellitus without complication (HCC)    Hypertension     Family History  Problem Relation Age of Onset   Hypertension Mother    Diabetes Father    COPD Father    Hypertension Father     Cancer Sister    High Cholesterol Brother    Crohn's disease Son    Past Surgical History:  Procedure Laterality Date   BUNIONECTOMY Bilateral 2007   CARPAL TUNNEL RELEASE Right 07/2019   REPLACEMENT TOTAL KNEE Left 03/2019   REPLACEMENT TOTAL KNEE Right 12/2018   TOE SURGERY Right    great toe x3   TONSILLECTOMY AND ADENOIDECTOMY     TYMPANOSTOMY TUBE PLACEMENT     WISDOM TOOTH EXTRACTION     20s   Social History[1] Social History   Social History Narrative   ** Merged History Encounter **         Immunization History  Administered Date(s) Administered   PFIZER(Purple Top)SARS-COV-2 Vaccination 11/24/2019, 12/15/2019   Pfizer Covid-19 Vaccine Bivalent Booster 44yrs & up 03/01/2021   Pfizer(Comirnaty)Fall Seasonal Vaccine 12 years and older 11/28/2021     Objective: Vital Signs: There were no vitals taken for this visit.   Physical Exam   Musculoskeletal Exam: ***  CDAI Exam: CDAI Score: -- Patient Global: --; Provider Global: -- Swollen: --; Tender: -- Joint Exam 03/10/2024   No joint exam has been documented for this visit   There is currently no information documented on the homunculus. Go to the Rheumatology activity and complete the homunculus joint exam.  Investigation: No additional findings.  Imaging: No results found.  Recent Labs: Lab Results  Component Value Date   WBC 8.2 01/28/2024   HGB 15.1 01/28/2024   PLT 280 01/28/2024   NA 138 01/28/2024   K 4.3 01/28/2024   CL 104 01/28/2024   CO2 29 01/28/2024  GLUCOSE 157 (H) 01/28/2024   BUN 16 01/28/2024   CREATININE 0.71 01/28/2024   BILITOT 0.4 01/28/2024   ALKPHOS 55 11/05/2023   AST 20 01/28/2024   ALT 30 (H) 01/28/2024   PROT 6.9 01/28/2024   ALBUMIN 4.5 11/05/2023   CALCIUM 9.3 01/28/2024   GFRAA 105 06/13/2020   QFTBGOLDPLUS NEGATIVE 04/04/2023    Speciality Comments: MTX since2004, Humira-2004-2017-quit working, cosyntex 2018x 8 months, restarted May 11, 2020-12/24   FU every 3 months  Procedures:  No procedures performed Allergies: Patient has no known allergies.   Assessment / Plan:     Visit Diagnoses: Psoriatic arthritis (HCC)  Psoriasis  High risk medication use  Pain of right thumb  Primary osteoarthritis of both hands  Trigger finger, right ring finger  Status post total knee replacement, bilateral  Pain in both feet  Bilateral sacroiliitis  Degeneration of intervertebral disc of lumbar region without discogenic back pain or lower extremity pain  Family history of Crohn's disease  Hiatal hernia with GERD without esophagitis  Mixed hyperlipidemia  Prediabetes  Benign essential hypertension  GAD (generalized anxiety disorder)  Hypermobility arthralgia  Smoker  Orders: No orders of the defined types were placed in this encounter.  No orders of the defined types were placed in this encounter.   Face-to-face time spent with patient was *** minutes. Greater than 50% of time was spent in counseling and coordination of care.  Follow-Up Instructions: No follow-ups on file.   Waddell CHRISTELLA Craze, PA-C  Note - This record has been created using Dragon software.  Chart creation errors have been sought, but may not always  have been located. Such creation errors do not reflect on  the standard of medical care.     [1]  Social History Tobacco Use   Smoking status: Every Day    Current packs/day: 0.75    Average packs/day: 0.8 packs/day for 32.0 years (24.0 ttl pk-yrs)    Types: Cigarettes    Passive exposure: Current   Smokeless tobacco: Never  Vaping Use   Vaping status: Former  Substance Use Topics   Alcohol use: Yes    Comment: occ   Drug use: Never   "

## 2024-02-27 NOTE — Progress Notes (Unsigned)
 "  Office Visit Note  Patient: Sherry Stein             Date of Birth: April 08, 1970           MRN: [id number]             PCP: Emilio Joesph DEL, PA-C Referring: Vaughn Lauraine KATHEE DEVONNA Visit Date: 02/28/2024 Occupation: Data Unavailable  Subjective:  No chief complaint on file.   History of Present Illness: Sherry Stein is a 54 y.o. female ***     Activities of Daily Living:  Patient reports morning stiffness for *** {minute/hour:19697}.   Patient {ACTIONS;DENIES/REPORTS:21021675::Denies} nocturnal pain.  Difficulty dressing/grooming: {ACTIONS;DENIES/REPORTS:21021675::Denies} Difficulty climbing stairs: {ACTIONS;DENIES/REPORTS:21021675::Denies} Difficulty getting out of chair: {ACTIONS;DENIES/REPORTS:21021675::Denies} Difficulty using hands for taps, buttons, cutlery, and/or writing: {ACTIONS;DENIES/REPORTS:21021675::Denies}  No Rheumatology ROS completed.   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   Psoriasis 05/09/2015   Menorrhagia with irregular cycle 04/10/2015   Hammer toe of right foot 04/07/2015   Pain from implanted hardware 04/07/2015    Past Medical History:  Diagnosis Date   Arthritis    Diabetes mellitus without complication (HCC)    Hypertension     Family History  Problem Relation Age of Onset   Hypertension Mother    Diabetes Father    COPD Father    Hypertension Father     Cancer Sister    High Cholesterol Brother    Crohn's disease Son    Past Surgical History:  Procedure Laterality Date   BUNIONECTOMY Bilateral 2007   CARPAL TUNNEL RELEASE Right 07/2019   REPLACEMENT TOTAL KNEE Left 03/2019   REPLACEMENT TOTAL KNEE Right 12/2018   TOE SURGERY Right    great toe x3   TONSILLECTOMY AND ADENOIDECTOMY     TYMPANOSTOMY TUBE PLACEMENT     WISDOM TOOTH EXTRACTION     20s   Social History[1] Social History   Social History Narrative   ** Merged History Encounter **         Immunization History  Administered Date(s) Administered   PFIZER(Purple Top)SARS-COV-2 Vaccination 11/24/2019, 12/15/2019   Pfizer Covid-19 Vaccine Bivalent Booster 54yrs & up 03/01/2021   Pfizer(Comirnaty)Fall Seasonal Vaccine 12 years and older 11/28/2021     Objective: Vital Signs: There were no vitals taken for this visit.   Physical Exam   Musculoskeletal Exam: ***  CDAI Exam: CDAI Score: -- Patient Global: --; Provider Global: -- Swollen: --; Tender: -- Joint Exam 02/28/2024   No joint exam has been documented for this visit   There is currently no information documented on the homunculus. Go to the Rheumatology activity and complete the homunculus joint exam.  Investigation: No additional findings.  Imaging: No results found.  Recent Labs: Lab Results  Component Value Date   WBC 8.2 01/28/2024   HGB 15.1 01/28/2024   PLT 280 01/28/2024   NA 138 01/28/2024   K 4.3 01/28/2024   CL 104 01/28/2024   CO2 29 01/28/2024  GLUCOSE 157 (H) 01/28/2024   BUN 16 01/28/2024   CREATININE 0.71 01/28/2024   BILITOT 0.4 01/28/2024   ALKPHOS 55 11/05/2023   AST 20 01/28/2024   ALT 30 (H) 01/28/2024   PROT 6.9 01/28/2024   ALBUMIN 4.5 11/05/2023   CALCIUM 9.3 01/28/2024   GFRAA 105 06/13/2020   QFTBGOLDPLUS NEGATIVE 04/04/2023    Speciality Comments: MTX since2004, Humira-2004-2017-quit working, cosyntex 2018x 8 months, restarted May 11, 2020-12/24   FU every 3 months  Procedures:  No procedures performed Allergies: Patient has no known allergies.   Assessment / Plan:     Visit Diagnoses: No diagnosis found.  Orders: No orders of the defined types were placed in this encounter.  No orders of the defined types were placed in this encounter.   Face-to-face time spent with patient was *** minutes. Greater than 50% of time was spent in counseling and coordination of care.  Follow-Up Instructions: No follow-ups on file.   Shelba SHAUNNA Potters, RT  Note - This record has been created using [employer].  Chart creation errors have been sought, but may not always  have been located. Such creation errors do not reflect on  the standard of medical care.    [1]  Social History Tobacco Use   Smoking status: Every Day    Current packs/day: 0.75    Average packs/day: 0.8 packs/day for 32.0 years (24.0 ttl pk-yrs)    Types: Cigarettes    Passive exposure: Current   Smokeless tobacco: Never  Vaping Use   Vaping status: Former  Substance Use Topics   Alcohol use: Yes    Comment: occ   Drug use: Never   "

## 2024-02-28 ENCOUNTER — Encounter: Payer: Self-pay | Admitting: Physician Assistant

## 2024-02-28 ENCOUNTER — Ambulatory Visit: Attending: Physician Assistant | Admitting: Physician Assistant

## 2024-02-28 VITALS — BP 118/77 | HR 67 | Temp 98.2°F | Resp 14 | Ht 64.0 in | Wt 221.8 lb

## 2024-02-28 DIAGNOSIS — M461 Sacroiliitis, not elsewhere classified: Secondary | ICD-10-CM | POA: Diagnosis not present

## 2024-02-28 DIAGNOSIS — M79644 Pain in right finger(s): Secondary | ICD-10-CM

## 2024-02-28 DIAGNOSIS — M19042 Primary osteoarthritis, left hand: Secondary | ICD-10-CM

## 2024-02-28 DIAGNOSIS — M65341 Trigger finger, right ring finger: Secondary | ICD-10-CM

## 2024-02-28 DIAGNOSIS — M2550 Pain in unspecified joint: Secondary | ICD-10-CM | POA: Diagnosis not present

## 2024-02-28 DIAGNOSIS — M51369 Other intervertebral disc degeneration, lumbar region without mention of lumbar back pain or lower extremity pain: Secondary | ICD-10-CM

## 2024-02-28 DIAGNOSIS — M19041 Primary osteoarthritis, right hand: Secondary | ICD-10-CM | POA: Diagnosis not present

## 2024-02-28 DIAGNOSIS — K219 Gastro-esophageal reflux disease without esophagitis: Secondary | ICD-10-CM

## 2024-02-28 DIAGNOSIS — Z96653 Presence of artificial knee joint, bilateral: Secondary | ICD-10-CM | POA: Diagnosis not present

## 2024-02-28 DIAGNOSIS — F17200 Nicotine dependence, unspecified, uncomplicated: Secondary | ICD-10-CM | POA: Diagnosis not present

## 2024-02-28 DIAGNOSIS — M79671 Pain in right foot: Secondary | ICD-10-CM | POA: Diagnosis not present

## 2024-02-28 DIAGNOSIS — Z79899 Other long term (current) drug therapy: Secondary | ICD-10-CM

## 2024-02-28 DIAGNOSIS — L409 Psoriasis, unspecified: Secondary | ICD-10-CM

## 2024-02-28 DIAGNOSIS — K449 Diaphragmatic hernia without obstruction or gangrene: Secondary | ICD-10-CM

## 2024-02-28 DIAGNOSIS — E782 Mixed hyperlipidemia: Secondary | ICD-10-CM

## 2024-02-28 DIAGNOSIS — R7303 Prediabetes: Secondary | ICD-10-CM

## 2024-02-28 DIAGNOSIS — Z8379 Family history of other diseases of the digestive system: Secondary | ICD-10-CM

## 2024-02-28 DIAGNOSIS — L4050 Arthropathic psoriasis, unspecified: Secondary | ICD-10-CM

## 2024-02-28 DIAGNOSIS — M79672 Pain in left foot: Secondary | ICD-10-CM

## 2024-02-28 DIAGNOSIS — I10 Essential (primary) hypertension: Secondary | ICD-10-CM

## 2024-02-28 DIAGNOSIS — F411 Generalized anxiety disorder: Secondary | ICD-10-CM

## 2024-02-28 LAB — CBC WITH DIFFERENTIAL/PLATELET
Absolute Lymphocytes: 2616 {cells}/uL (ref 850–3900)
Absolute Monocytes: 628 {cells}/uL (ref 200–950)
Basophils Absolute: 52 {cells}/uL (ref 0–200)
Basophils Relative: 0.5 %
Eosinophils Absolute: 361 {cells}/uL (ref 15–500)
Eosinophils Relative: 3.5 %
HCT: 46.8 % — ABNORMAL HIGH (ref 35.9–46.0)
Hemoglobin: 15.7 g/dL — ABNORMAL HIGH (ref 11.7–15.5)
MCH: 33.1 pg — ABNORMAL HIGH (ref 27.0–33.0)
MCHC: 33.5 g/dL (ref 31.6–35.4)
MCV: 98.7 fL (ref 81.4–101.7)
MPV: 10.4 fL (ref 7.5–12.5)
Monocytes Relative: 6.1 %
Neutro Abs: 6644 {cells}/uL (ref 1500–7800)
Neutrophils Relative %: 64.5 %
Platelets: 287 Thousand/uL (ref 140–400)
RBC: 4.74 Million/uL (ref 3.80–5.10)
RDW: 12.7 % (ref 11.0–15.0)
Total Lymphocyte: 25.4 %
WBC: 10.3 Thousand/uL (ref 3.8–10.8)

## 2024-02-28 LAB — COMPREHENSIVE METABOLIC PANEL WITH GFR
AG Ratio: 1.8 (calc) (ref 1.0–2.5)
ALT: 19 U/L (ref 6–29)
AST: 18 U/L (ref 10–35)
Albumin: 4.6 g/dL (ref 3.6–5.1)
Alkaline phosphatase (APISO): 67 U/L (ref 37–153)
BUN: 18 mg/dL (ref 7–25)
CO2: 30 mmol/L (ref 20–32)
Calcium: 9.1 mg/dL (ref 8.6–10.4)
Chloride: 101 mmol/L (ref 98–110)
Creat: 0.8 mg/dL (ref 0.50–1.03)
Globulin: 2.6 g/dL (ref 1.9–3.7)
Glucose, Bld: 112 mg/dL — ABNORMAL HIGH (ref 65–99)
Potassium: 4.3 mmol/L (ref 3.5–5.3)
Sodium: 140 mmol/L (ref 135–146)
Total Bilirubin: 0.4 mg/dL (ref 0.2–1.2)
Total Protein: 7.2 g/dL (ref 6.1–8.1)
eGFR: 87 mL/min/1.73m2

## 2024-03-02 ENCOUNTER — Ambulatory Visit: Payer: Self-pay | Admitting: Physician Assistant

## 2024-03-02 NOTE — Progress Notes (Signed)
 Glucose is 112, rest of CMP WNL Hgb and hct are elevated-we will continue to monitor.

## 2024-03-06 NOTE — Telephone Encounter (Signed)
 Per rep, they did not receive application. Refaxed.  Phone: [phone] Fax: [phone]

## 2024-03-10 ENCOUNTER — Ambulatory Visit: Admitting: Physician Assistant

## 2024-03-10 DIAGNOSIS — M79671 Pain in right foot: Secondary | ICD-10-CM

## 2024-03-10 DIAGNOSIS — M79644 Pain in right finger(s): Secondary | ICD-10-CM

## 2024-03-10 DIAGNOSIS — Z8379 Family history of other diseases of the digestive system: Secondary | ICD-10-CM

## 2024-03-10 DIAGNOSIS — Z79899 Other long term (current) drug therapy: Secondary | ICD-10-CM

## 2024-03-10 DIAGNOSIS — F411 Generalized anxiety disorder: Secondary | ICD-10-CM

## 2024-03-10 DIAGNOSIS — E782 Mixed hyperlipidemia: Secondary | ICD-10-CM

## 2024-03-10 DIAGNOSIS — I10 Essential (primary) hypertension: Secondary | ICD-10-CM

## 2024-03-10 DIAGNOSIS — M65341 Trigger finger, right ring finger: Secondary | ICD-10-CM

## 2024-03-10 DIAGNOSIS — R7303 Prediabetes: Secondary | ICD-10-CM

## 2024-03-10 DIAGNOSIS — L4050 Arthropathic psoriasis, unspecified: Secondary | ICD-10-CM

## 2024-03-10 DIAGNOSIS — M19041 Primary osteoarthritis, right hand: Secondary | ICD-10-CM

## 2024-03-10 DIAGNOSIS — M2550 Pain in unspecified joint: Secondary | ICD-10-CM

## 2024-03-10 DIAGNOSIS — L409 Psoriasis, unspecified: Secondary | ICD-10-CM

## 2024-03-10 DIAGNOSIS — M461 Sacroiliitis, not elsewhere classified: Secondary | ICD-10-CM

## 2024-03-10 DIAGNOSIS — Z96653 Presence of artificial knee joint, bilateral: Secondary | ICD-10-CM

## 2024-03-10 DIAGNOSIS — M51369 Other intervertebral disc degeneration, lumbar region without mention of lumbar back pain or lower extremity pain: Secondary | ICD-10-CM

## 2024-03-10 DIAGNOSIS — F17200 Nicotine dependence, unspecified, uncomplicated: Secondary | ICD-10-CM

## 2024-03-10 DIAGNOSIS — K219 Gastro-esophageal reflux disease without esophagitis: Secondary | ICD-10-CM

## 2024-03-11 NOTE — Progress Notes (Unsigned)
 " [facility] [address] [place] [address] Phone: [phone] Subjective:   Sherry Stein, am serving as a scribe for Dr. Arthea Claudene.  I'm seeing this patient by the request  of:  Emilio Joesph VEAR DEVONNA  CC:   YEP:Dlagzrupcz  11/05/2023 Hypermobility has been noted for quite some time but has had multiple different dislocations noted of the left shoulder previously.  Patient has also had significant psoriatic arthritis so difficult to assess which 1 is causing which type of pain.  Discussed with patient that will get some laboratory workup to see if anything is potentially elevated.  Will get a echocardiogram to further evaluate the aorta, encouraged her to have once annual eye exams, discussed with patient over-the-counter medications that could be helpful in limiting certain range of motion.  Will work with physical therapy who does specialize in hypermobility disorders.  Follow-up with me again in 3 months otherwise.     Updated 03/13/2024 Sherry Stein is a 54 y.o. female coming in with complaint of hypermobility. Patient states that she is going to have a revision sometime this year for the R knee. Wants to see optometrist and wants to know what she should ask them due to having hypermobility.   Would like to know what activity she can do to get some exercise with hypermobility.       Past Medical History:  Diagnosis Date   Arthritis    Diabetes mellitus without complication (HCC)    Hypertension    Past Surgical History:  Procedure Laterality Date   BUNIONECTOMY Bilateral 2007   CARPAL TUNNEL RELEASE Right 07/2019   REPLACEMENT TOTAL KNEE Left 03/2019   REPLACEMENT TOTAL KNEE Right 12/2018   TOE SURGERY Right    great toe x3   TONSILLECTOMY AND ADENOIDECTOMY     TYMPANOSTOMY TUBE PLACEMENT     WISDOM TOOTH EXTRACTION     20s   Social History   Socioeconomic History   Marital status: Divorced    Spouse name: Not on  file   Number of children: Not on file   Years of education: Not on file   Highest education level: Not on file  Occupational History   Not on file  Tobacco Use   Smoking status: Every Day    Current packs/day: 0.75    Average packs/day: 0.8 packs/day for 32.0 years (24.0 ttl pk-yrs)    Types: Cigarettes    Passive exposure: Current   Smokeless tobacco: Never  Vaping Use   Vaping status: Former  Substance and Sexual Activity   Alcohol use: Yes    Comment: occ   Drug use: Never   Sexual activity: Not on file  Other Topics Concern   Not on file  Social History Narrative   ** Merged History Encounter **       Social Drivers of Health   Tobacco Use: High Risk (03/10/2024)   Received from [facility]   Patient History    Smoking Tobacco Use: Every Day    Smokeless Tobacco Use: Never    Passive Exposure: Current  Financial Resource Strain: Low Risk (11/28/2023)   Received from [facility]   Overall Financial Resource Strain (CARDIA)    How hard is it for you to pay for the very basics like food, housing, medical care, and heating?: Not very hard  Food Insecurity: No Food Insecurity (11/28/2023)   Received from [facility]   Epic    Within the  past 12 months, you worried that your food would run out before you got the money to buy more.: Never true    Within the past 12 months, the food you bought just didn't last and you didn't have money to get more.: Never true  Transportation Needs: No Transportation Needs (11/28/2023)   Received from [facility]    In the past 12 months, has lack of transportation kept you from medical appointments or from getting medications?: No    In the past 12 months, has lack of transportation kept you from meetings, work, or from getting things needed for daily living?: No  Physical Activity: Insufficiently Active (11/28/2023)   Received from [facility]   Exercise Vital Sign    On average, how many days per week do you engage in  moderate to strenuous exercise (like a brisk walk)?: 2 days    On average, how many minutes do you engage in exercise at this level?: 20 min  Stress: No Stress Concern Present (11/28/2023)   Received from [facility] - Occupational Stress Questionnaire    Do you feel stress - tense, restless, nervous, or anxious, or unable to sleep at night because your mind is troubled all the time - these days?: Only a little  Social Connections: Moderately Integrated (11/28/2023)   Received from [facility]   Social Network    How would you rate your social network (family, work, friends)?: Adequate participation with social networks  Depression (PHQ2-9): Not on file  Alcohol Screen: Not on file  Housing: Low Risk (11/28/2023)   Received from [facility]    In the last 12 months, was there a time when you were not able to pay the mortgage or rent on time?: No    In the past 12 months, how many times have you moved where you were living?: 0    At any time in the past 12 months, were you homeless or living in a shelter (including now)?: No  Utilities: Not At Risk (11/28/2023)   Received from [facility]    In the past 12 months has the electric, gas, oil, or water company threatened to shut off services in your home?: No  Health Literacy: Not on file   Allergies[1] Family History  Problem Relation Age of Onset   Hypertension Mother    Diabetes Father    COPD Father    Hypertension Father    Cancer Sister    High Cholesterol Brother    Crohn's disease Son     Current Outpatient Medications (Endocrine & Metabolic):    metFORMIN (GLUCOPHAGE-XR) 500 MG 24 hr tablet,   Current Outpatient Medications (Cardiovascular):    lisinopril-hydrochlorothiazide (PRINZIDE,ZESTORETIC) 20-12.5 MG tablet, Take by mouth.   rosuvastatin (CRESTOR) 10 MG tablet, Take by mouth. (Patient not taking: Reported on 02/28/2024)   rosuvastatin (CRESTOR) 20 MG  tablet, Take 20 mg by mouth at bedtime.  Current Outpatient Medications (Respiratory):    fexofenadine (ALLEGRA) 180 MG tablet, Take 180 mg by mouth daily.  Current Outpatient Medications (Analgesics):    diclofenac (VOLTAREN) 75 MG EC tablet, 75 mg 2 (two) times daily. (Patient taking differently: 75 mg daily.)  Current Outpatient Medications (Hematological):    folic acid  (FOLVITE ) 1 MG tablet, TAKE 2 TABLETS BY MOUTH DAILY  Current Outpatient Medications (Other):    buPROPion (WELLBUTRIN XL) 150 MG 24 hr tablet, Take by mouth.  cholecalciferol (VITAMIN D3) 25 MCG (1000 UNIT) tablet, Take 1,000 Units by mouth daily. (Patient not taking: Reported on 02/28/2024)   Cholecalciferol 1.25 MG (50000 UT) capsule, Take 1 capsule by mouth.   cyclobenzaprine (FLEXERIL) 10 MG tablet, Take 10 mg by mouth at bedtime.   DULoxetine (CYMBALTA) 60 MG capsule, Take 1 capsule by mouth daily.   hydrOXYzine (ATARAX) 10 MG tablet, Take by mouth as needed. (Patient not taking: Reported on 02/28/2024)   methotrexate  (RHEUMATREX) 2.5 MG tablet, TAKE 10 TABLETS BY MOUTH ONCE A WEEK **PROTECT FROM LIGHT** (Patient taking differently: 12.5 mg.)   Multiple Vitamin (MULTIVITAMIN) tablet, Take 1 tablet by mouth daily.   TALTZ  80 MG/ML pen, INJECT 80MG  (1 PEN) UNDER THE SKIN EVERY 4 WEEKS (AFTER WEEK 12)   Reviewed prior external information including notes and imaging from  primary care provider As well as notes that were available from care everywhere and other healthcare systems.  Past medical history, social, surgical and family history all reviewed in electronic medical record.  No pertanent information unless stated regarding to the chief complaint.   Review of Systems:  No headache, visual changes, nausea, vomiting, diarrhea, constipation, dizziness, abdominal pain, skin rash, fevers, chills, night sweats, weight loss, swollen lymph nodes, body aches, joint swelling, chest pain, shortness of breath, mood  changes. POSITIVE muscle aches  Objective  There were no vitals taken for this visit.   General: No apparent distress alert and oriented x3 mood and affect normal, dressed appropriately.  HEENT: Pupils equal, extraocular movements intact  Respiratory: Patient's speak in full sentences and does not appear short of breath  Cardiovascular: No lower extremity edema, non tender, no erythema      Impression and Recommendations:           [1] No Known Allergies  "

## 2024-03-13 ENCOUNTER — Ambulatory Visit: Admitting: Family Medicine

## 2024-03-13 NOTE — Patient Instructions (Addendum)
 Focus on strengthening exercises: machine weight Seated elliptical  Ask eye doc about lens Think about knee brace See me as needed

## 2024-05-28 ENCOUNTER — Ambulatory Visit: Admitting: Physician Assistant
